# Patient Record
Sex: Female | Born: 1955 | Race: White | Hispanic: No | Marital: Married | Smoking: Former smoker
Health system: Southern US, Community
[De-identification: ages and names within clinical notes are randomized; demographics above are authoritative.]

## PROBLEM LIST (undated history)

## (undated) DIAGNOSIS — F419 Anxiety disorder, unspecified: Secondary | ICD-10-CM

## (undated) DIAGNOSIS — I10 Essential (primary) hypertension: Secondary | ICD-10-CM

## (undated) DIAGNOSIS — E785 Hyperlipidemia, unspecified: Secondary | ICD-10-CM

## (undated) DIAGNOSIS — K219 Gastro-esophageal reflux disease without esophagitis: Secondary | ICD-10-CM

## (undated) DIAGNOSIS — M1990 Unspecified osteoarthritis, unspecified site: Secondary | ICD-10-CM

## (undated) HISTORY — DX: Essential (primary) hypertension: I10

## (undated) HISTORY — DX: Anxiety disorder, unspecified: F41.9

## (undated) HISTORY — PX: GUM SURGERY: SHX658

## (undated) HISTORY — PX: WISDOM TOOTH EXTRACTION: SHX21

## (undated) HISTORY — DX: Unspecified osteoarthritis, unspecified site: M19.90

## (undated) HISTORY — DX: Hyperlipidemia, unspecified: E78.5

---

## 1998-03-15 ENCOUNTER — Other Ambulatory Visit: Admission: RE | Admit: 1998-03-15 | Discharge: 1998-03-15 | Payer: Self-pay | Admitting: Obstetrics and Gynecology

## 1998-11-01 ENCOUNTER — Other Ambulatory Visit: Admission: RE | Admit: 1998-11-01 | Discharge: 1998-11-01 | Payer: Self-pay | Admitting: Obstetrics and Gynecology

## 1999-06-10 ENCOUNTER — Other Ambulatory Visit: Admission: RE | Admit: 1999-06-10 | Discharge: 1999-06-10 | Payer: Self-pay | Admitting: Obstetrics and Gynecology

## 2000-09-12 ENCOUNTER — Other Ambulatory Visit: Admission: RE | Admit: 2000-09-12 | Discharge: 2000-09-12 | Payer: Self-pay | Admitting: Obstetrics and Gynecology

## 2002-08-28 ENCOUNTER — Other Ambulatory Visit: Admission: RE | Admit: 2002-08-28 | Discharge: 2002-08-28 | Payer: Self-pay | Admitting: Obstetrics and Gynecology

## 2003-11-13 ENCOUNTER — Other Ambulatory Visit: Admission: RE | Admit: 2003-11-13 | Discharge: 2003-11-13 | Payer: Self-pay | Admitting: Obstetrics and Gynecology

## 2004-09-28 ENCOUNTER — Other Ambulatory Visit: Admission: RE | Admit: 2004-09-28 | Discharge: 2004-09-28 | Payer: Self-pay | Admitting: Obstetrics and Gynecology

## 2006-03-30 ENCOUNTER — Encounter: Admission: RE | Admit: 2006-03-30 | Discharge: 2006-03-30 | Payer: Self-pay | Admitting: Obstetrics and Gynecology

## 2009-02-11 ENCOUNTER — Encounter: Admission: RE | Admit: 2009-02-11 | Discharge: 2009-03-03 | Payer: Self-pay | Admitting: Family Medicine

## 2009-03-04 ENCOUNTER — Encounter: Admission: RE | Admit: 2009-03-04 | Discharge: 2009-03-09 | Payer: Self-pay | Admitting: Family Medicine

## 2010-03-27 ENCOUNTER — Encounter: Payer: Self-pay | Admitting: Obstetrics and Gynecology

## 2011-02-01 ENCOUNTER — Encounter: Payer: Self-pay | Admitting: Gastroenterology

## 2011-02-23 ENCOUNTER — Encounter: Payer: Self-pay | Admitting: Gastroenterology

## 2011-02-23 ENCOUNTER — Ambulatory Visit (AMBULATORY_SURGERY_CENTER): Payer: BC Managed Care – PPO | Admitting: *Deleted

## 2011-02-23 VITALS — Ht 63.0 in | Wt 135.8 lb

## 2011-02-23 DIAGNOSIS — Z1211 Encounter for screening for malignant neoplasm of colon: Secondary | ICD-10-CM

## 2011-02-23 MED ORDER — PEG-KCL-NACL-NASULF-NA ASC-C 100 G PO SOLR: 1.0000 | Freq: Once | ORAL | Status: DC

## 2011-03-15 ENCOUNTER — Telehealth: Payer: Self-pay | Admitting: Gastroenterology

## 2011-03-15 NOTE — Telephone Encounter (Signed)
Called patient regarding the questions she had about the prep for her colonoscopy tomorrow.  I answered all questions and patient was satisfied.

## 2011-03-16 ENCOUNTER — Ambulatory Visit (AMBULATORY_SURGERY_CENTER): Payer: BC Managed Care – PPO | Admitting: Gastroenterology

## 2011-03-16 ENCOUNTER — Encounter: Payer: Self-pay | Admitting: Gastroenterology

## 2011-03-16 ENCOUNTER — Other Ambulatory Visit: Payer: Self-pay | Admitting: Gastroenterology

## 2011-03-16 DIAGNOSIS — D126 Benign neoplasm of colon, unspecified: Secondary | ICD-10-CM

## 2011-03-16 DIAGNOSIS — Z1211 Encounter for screening for malignant neoplasm of colon: Secondary | ICD-10-CM

## 2011-03-16 MED ORDER — SODIUM CHLORIDE 0.9 % IV SOLN: 500.0000 mL | INTRAVENOUS | Status: DC

## 2011-03-16 NOTE — Op Note (Signed)
[facility] [address] [place], [place]  [address]  COLONOSCOPY PROCEDURE REPORT  PATIENT:  Pamela, Ross  MR#:  [id number] BIRTHDATE:  06-Jan-1956, 55 yrs. old  GENDER:  female ENDOSCOPIST:  Judie Petit T. Russella Dar, MD, [facility] Referred by:  Duane Lope, M.D. PROCEDURE DATE:  03/16/2011 PROCEDURE:  Colonoscopy with snare polypectomy ASA CLASS:  Class II INDICATIONS:  1) Routine Risk Screening MEDICATIONS:   These medications were titrated to patient response per physician's verbal order, Fentanyl 125 mcg IV, Versed 10 mg IV DESCRIPTION OF PROCEDURE:  After the risks benefits and alternatives of the procedure were thoroughly explained, informed consent was obtained.  Digital rectal exam was performed and revealed no abnormalities. The [id number] endoscope was introduced through the anus and advanced to the cecum, which was identified by both the appendix and ileocecal valve, without limitations.  The quality of the prep was good, using MoviPrep. The instrument was then slowly withdrawn as the colon was fully examined.<<PROCEDUREIMAGES>> FINDINGS:  A sessile polyp was found in the descending colon. It was 5 mm in size. Polyp was snared without cautery. Retrieval was unsuccessful.  A sessile polyp was found in the sigmoid colon. It was 5 mm in size. Polyp was snared without cautery. Retrieval was successful. Otherwise normal colonoscopy without other polyps, masses, vascular ectasias, or inflammatory changes. Retroflexed views in the rectum revealed internal hemorrhoids, moderate. The time to cecum =  5  minutes. The scope was then withdrawn (time = 12  min) from the patient and the procedure completed.  COMPLICATIONS:  None  ENDOSCOPIC IMPRESSION: 1) 5 mm sessile polyp in the descending colon 2) 5 mm sessile polyp in the sigmoid colon 3) Internal hemorrhoids  RECOMMENDATIONS: 1) Await pathology results 2) If the polyps are adenomatous (pre-cancerous),  repeat colonoscopy in 5 years. Otherwise to follow colorectal cancer screening guidelines for "routine risk" patients with colonoscopy in 10 years.  Venita Lick. Russella Dar, MD, [facility]  n. eSIGNED:   Venita Lick. Maralyn Witherell at 03/16/2011 10:12 AM  Geralyn Flash, [id number]

## 2011-03-16 NOTE — Progress Notes (Signed)
Patient did not experience any of the following events: a burn prior to discharge; a fall within the facility; wrong site/side/patient/procedure/implant event; or a hospital transfer or hospital admission upon discharge from the facility. (G8907) Patient did not have preoperative order for IV antibiotic SSI prophylaxis. (G8918)  

## 2011-03-17 ENCOUNTER — Telehealth: Payer: Self-pay | Admitting: *Deleted

## 2011-03-17 NOTE — Telephone Encounter (Signed)
Follow up Call- Patient questions:  Do you have a fever, pain , or abdominal swelling? no Pain Score  0 *  Have you tolerated food without any problems? yes  Have you been able to return to your normal activities? yes  Do you have any questions about your discharge instructions: Diet   yes Medications  no Follow up visit  no  Do you have questions or concerns about your Care? yes  Actions: * If pain score is 4 or above: No action needed, pain <4.   

## 2011-03-22 ENCOUNTER — Encounter: Payer: Self-pay | Admitting: Gastroenterology

## 2011-10-27 ENCOUNTER — Ambulatory Visit: Payer: BC Managed Care – PPO | Attending: Obstetrics and Gynecology | Admitting: Physical Therapy

## 2011-10-27 DIAGNOSIS — IMO0001 Reserved for inherently not codable concepts without codable children: Secondary | ICD-10-CM | POA: Insufficient documentation

## 2011-10-27 DIAGNOSIS — M25519 Pain in unspecified shoulder: Secondary | ICD-10-CM | POA: Insufficient documentation

## 2011-11-03 ENCOUNTER — Ambulatory Visit: Payer: BC Managed Care – PPO | Admitting: Physical Therapy

## 2011-11-08 ENCOUNTER — Ambulatory Visit: Payer: BC Managed Care – PPO | Attending: Obstetrics and Gynecology | Admitting: Physical Therapy

## 2011-11-08 DIAGNOSIS — IMO0001 Reserved for inherently not codable concepts without codable children: Secondary | ICD-10-CM | POA: Insufficient documentation

## 2011-11-08 DIAGNOSIS — M25519 Pain in unspecified shoulder: Secondary | ICD-10-CM | POA: Insufficient documentation

## 2011-11-10 ENCOUNTER — Ambulatory Visit: Payer: BC Managed Care – PPO | Admitting: Physical Therapy

## 2011-11-13 ENCOUNTER — Encounter: Payer: BC Managed Care – PPO | Admitting: Physical Therapy

## 2011-11-15 ENCOUNTER — Ambulatory Visit: Payer: BC Managed Care – PPO | Admitting: Physical Therapy

## 2011-11-22 ENCOUNTER — Ambulatory Visit: Payer: BC Managed Care – PPO | Admitting: Physical Therapy

## 2011-11-24 ENCOUNTER — Ambulatory Visit: Payer: BC Managed Care – PPO | Admitting: Physical Therapy

## 2011-11-29 ENCOUNTER — Ambulatory Visit: Payer: BC Managed Care – PPO | Admitting: Physical Therapy

## 2011-12-06 ENCOUNTER — Ambulatory Visit: Payer: BC Managed Care – PPO | Attending: Obstetrics and Gynecology | Admitting: Physical Therapy

## 2011-12-06 DIAGNOSIS — IMO0001 Reserved for inherently not codable concepts without codable children: Secondary | ICD-10-CM | POA: Insufficient documentation

## 2011-12-06 DIAGNOSIS — M25519 Pain in unspecified shoulder: Secondary | ICD-10-CM | POA: Insufficient documentation

## 2011-12-11 ENCOUNTER — Ambulatory Visit: Payer: BC Managed Care – PPO | Admitting: Physical Therapy

## 2011-12-14 ENCOUNTER — Encounter (INDEPENDENT_AMBULATORY_CARE_PROVIDER_SITE_OTHER): Payer: BC Managed Care – PPO | Admitting: Ophthalmology

## 2011-12-14 DIAGNOSIS — H35039 Hypertensive retinopathy, unspecified eye: Secondary | ICD-10-CM

## 2011-12-14 DIAGNOSIS — H2510 Age-related nuclear cataract, unspecified eye: Secondary | ICD-10-CM

## 2011-12-14 DIAGNOSIS — H35349 Macular cyst, hole, or pseudohole, unspecified eye: Secondary | ICD-10-CM

## 2011-12-14 DIAGNOSIS — H43819 Vitreous degeneration, unspecified eye: Secondary | ICD-10-CM

## 2011-12-14 DIAGNOSIS — I10 Essential (primary) hypertension: Secondary | ICD-10-CM

## 2011-12-15 DIAGNOSIS — H35349 Macular cyst, hole, or pseudohole, unspecified eye: Secondary | ICD-10-CM

## 2011-12-15 NOTE — H&P (Signed)
Pamela Ross is an 56 y.o. female.   Chief Complaint:loss of vision right eye HPI: macular hole right eye  Past Medical History  Diagnosis Date  . Anxiety   . Arthritis   . Hyperlipidemia   . Hypertension     Past Surgical History  Procedure Date  . Gum surgery     Family History  Problem Relation Age of Onset  . Colon cancer Neg Hx   . Esophageal cancer Neg Hx   . Stomach cancer Neg Hx   . Rectal cancer Neg Hx    Social History:  reports that she has been smoking.  She does not have any smokeless tobacco history on file. She reports that she drinks about 4.2 ounces of alcohol per week. She reports that she does not use illicit drugs.  Allergies:  Allergies  Allergen Reactions  . Codeine Nausea Only  . Erythromycin Diarrhea    No prescriptions prior to admission    Review of systems otherwise negative  There were no vitals taken for this visit.  Physical exam: Mental status: oriented x3. Eyes: See eye exam associated with this date of surgery in media tab.  Scanned in by scanning center Ears, Nose, Throat: within normal limits Neck: Within Normal limits General: within normal limits Chest: Within normal limits Breast: deferred Heart: Within normal limits Abdomen: Within normal limits GU: deferred Extremities: within normal limits Skin: within normal limits  Assessment/Plan Macular hole right eye Plan: To [facility] for Pars plana vitrectomy, serum patch, laser, gas injection right eye  Sherrie George 12/15/2011, 10:35 AM

## 2011-12-19 ENCOUNTER — Ambulatory Visit: Payer: BC Managed Care – PPO | Admitting: Physical Therapy

## 2011-12-22 ENCOUNTER — Encounter (HOSPITAL_COMMUNITY): Payer: Self-pay | Admitting: *Deleted

## 2011-12-22 NOTE — Progress Notes (Signed)
Pt would like to take medicines from home.

## 2011-12-25 ENCOUNTER — Encounter (HOSPITAL_COMMUNITY): Payer: Self-pay | Admitting: Pharmacy Technician

## 2011-12-25 MED ORDER — CEFAZOLIN SODIUM-DEXTROSE 2-3 GM-% IV SOLR
2.0000 g | INTRAVENOUS | Status: DC
  Filled 2011-12-25: qty 50

## 2011-12-26 ENCOUNTER — Encounter (HOSPITAL_COMMUNITY)
Admission: RE | Disposition: A | Discharge status: Home / Self Care | Payer: Self-pay | Source: Ambulatory Visit | Attending: Ophthalmology

## 2011-12-26 ENCOUNTER — Ambulatory Visit (HOSPITAL_COMMUNITY): Payer: BC Managed Care – PPO

## 2011-12-26 ENCOUNTER — Ambulatory Visit (HOSPITAL_COMMUNITY)
Admission: RE | Admit: 2011-12-26 | Discharge: 2011-12-27 | Disposition: A | Discharge status: Home / Self Care | Payer: BC Managed Care – PPO | Source: Ambulatory Visit | Attending: Ophthalmology | Admitting: Ophthalmology

## 2011-12-26 ENCOUNTER — Encounter (HOSPITAL_COMMUNITY): Payer: Self-pay | Admitting: *Deleted

## 2011-12-26 ENCOUNTER — Encounter (HOSPITAL_COMMUNITY): Payer: Self-pay

## 2011-12-26 DIAGNOSIS — H35349 Macular cyst, hole, or pseudohole, unspecified eye: Secondary | ICD-10-CM

## 2011-12-26 DIAGNOSIS — I10 Essential (primary) hypertension: Secondary | ICD-10-CM | POA: Insufficient documentation

## 2011-12-26 HISTORY — PX: GAS INSERTION: SHX5336

## 2011-12-26 LAB — CBC
HCT: 38.6 % (ref 36.0–46.0)
MCV: 92.1 fL (ref 78.0–100.0)
RBC: 4.19 MIL/uL (ref 3.87–5.11)
RDW: 12.5 % (ref 11.5–15.5)
WBC: 9.9 10*3/uL (ref 4.0–10.5)

## 2011-12-26 LAB — BASIC METABOLIC PANEL
CO2: 25 mEq/L (ref 19–32)
Chloride: 90 mEq/L — ABNORMAL LOW (ref 96–112)
Creatinine, Ser: 0.65 mg/dL (ref 0.50–1.10)

## 2011-12-26 LAB — SURGICAL PCR SCREEN: Staphylococcus aureus: NEGATIVE

## 2011-12-26 SURGERY — 25 GAUGE PARS PLANA VITRECTOMY WITH 20 GAUGE MVR PORT FOR MACULAR HOLE
Anesthesia: General | Site: Eye | Laterality: Right | Wound class: Clean

## 2011-12-26 MED ORDER — ONDANSETRON HCL 4 MG/2ML IJ SOLN: 4.0000 mg | Freq: Four times a day (QID) | INTRAMUSCULAR | Status: DC | PRN

## 2011-12-26 MED ORDER — ATROPINE SULFATE 1 % OP SOLN
OPHTHALMIC | Status: DC | PRN
  Administered 2011-12-26: 2 [drp] via OPHTHALMIC

## 2011-12-26 MED ORDER — SIMETHICONE 80 MG PO CHEW
160.0000 mg | CHEWABLE_TABLET | Freq: Four times a day (QID) | ORAL | Status: DC | PRN
  Administered 2011-12-26 – 2011-12-27 (×2): 160 mg via ORAL
  Filled 2011-12-26 (×2): qty 2

## 2011-12-26 MED ORDER — EPINEPHRINE HCL 1 MG/ML IJ SOLN
INTRAMUSCULAR | Status: AC
  Filled 2011-12-26: qty 1

## 2011-12-26 MED ORDER — BSS IO SOLN
INTRAOCULAR | Status: AC
  Filled 2011-12-26: qty 15

## 2011-12-26 MED ORDER — CALCIUM CARBONATE 600 MG PO TABS
600.0000 mg | ORAL_TABLET | Freq: Two times a day (BID) | ORAL | Status: DC
  Filled 2011-12-26 (×2): qty 1

## 2011-12-26 MED ORDER — PROVISC 10 MG/ML IO SOLN
INTRAOCULAR | Status: DC | PRN
  Administered 2011-12-26: .85 mL via INTRAOCULAR

## 2011-12-26 MED ORDER — BACITRACIN-POLYMYXIN B 500-10000 UNIT/GM OP OINT: 1.0000 "application " | TOPICAL_OINTMENT | Freq: Four times a day (QID) | OPHTHALMIC | Status: DC

## 2011-12-26 MED ORDER — GATIFLOXACIN 0.5 % OP SOLN
OPHTHALMIC | Status: AC
  Administered 2011-12-26: 1 [drp] via OPHTHALMIC
  Filled 2011-12-26: qty 2.5

## 2011-12-26 MED ORDER — CALCIUM CARBONATE 1250 (500 CA) MG PO TABS
1.0000 | ORAL_TABLET | Freq: Two times a day (BID) | ORAL | Status: DC
  Filled 2011-12-26 (×4): qty 1

## 2011-12-26 MED ORDER — NEOSTIGMINE METHYLSULFATE 1 MG/ML IJ SOLN
INTRAMUSCULAR | Status: DC | PRN
  Administered 2011-12-26: 3 mg via INTRAVENOUS

## 2011-12-26 MED ORDER — PHENYLEPHRINE HCL 2.5 % OP SOLN
1.0000 [drp] | OPHTHALMIC | Status: AC | PRN
  Administered 2011-12-26 (×3): 1 [drp] via OPHTHALMIC

## 2011-12-26 MED ORDER — DEXAMETHASONE SODIUM PHOSPHATE 10 MG/ML IJ SOLN
INTRAMUSCULAR | Status: DC | PRN
  Administered 2011-12-26: 10 mg

## 2011-12-26 MED ORDER — ATROPINE SULFATE 1 % OP SOLN
OPHTHALMIC | Status: AC
  Filled 2011-12-26: qty 2

## 2011-12-26 MED ORDER — LOSARTAN POTASSIUM 50 MG PO TABS
100.0000 mg | ORAL_TABLET | Freq: Every day | ORAL | Status: DC
  Filled 2011-12-26 (×2): qty 2

## 2011-12-26 MED ORDER — ACETAZOLAMIDE SODIUM 500 MG IJ SOLR
INTRAMUSCULAR | Status: AC
  Filled 2011-12-26: qty 500

## 2011-12-26 MED ORDER — MAGNESIUM GLUCONATE 500 MG PO TABS
500.0000 mg | ORAL_TABLET | Freq: Every day | ORAL | Status: DC
  Filled 2011-12-26 (×2): qty 1

## 2011-12-26 MED ORDER — DOCUSATE SODIUM 100 MG PO CAPS
100.0000 mg | ORAL_CAPSULE | Freq: Two times a day (BID) | ORAL | Status: DC
  Administered 2011-12-26: 100 mg via ORAL
  Filled 2011-12-26: qty 1

## 2011-12-26 MED ORDER — HYPROMELLOSE (GONIOSCOPIC) 2.5 % OP SOLN
OPHTHALMIC | Status: AC
  Filled 2011-12-26: qty 15

## 2011-12-26 MED ORDER — PHENYLEPHRINE HCL 2.5 % OP SOLN
OPHTHALMIC | Status: AC
  Administered 2011-12-26: 1 [drp] via OPHTHALMIC
  Filled 2011-12-26: qty 3

## 2011-12-26 MED ORDER — BUPIVACAINE HCL (PF) 0.75 % IJ SOLN
INTRAMUSCULAR | Status: DC | PRN
  Administered 2011-12-26: 10 mL

## 2011-12-26 MED ORDER — FENTANYL CITRATE 0.05 MG/ML IJ SOLN
INTRAMUSCULAR | Status: DC | PRN
  Administered 2011-12-26: 100 ug via INTRAVENOUS

## 2011-12-26 MED ORDER — GATIFLOXACIN 0.5 % OP SOLN
1.0000 [drp] | Freq: Four times a day (QID) | OPHTHALMIC | Status: DC
  Filled 2011-12-26: qty 2.5

## 2011-12-26 MED ORDER — MINERAL OIL LIGHT 100 % EX OIL
TOPICAL_OIL | CUTANEOUS | Status: AC
  Filled 2011-12-26: qty 25

## 2011-12-26 MED ORDER — LIDOCAINE HCL 2 % IJ SOLN
INTRAMUSCULAR | Status: AC
  Filled 2011-12-26: qty 20

## 2011-12-26 MED ORDER — ROCURONIUM BROMIDE 100 MG/10ML IV SOLN
INTRAVENOUS | Status: DC | PRN
  Administered 2011-12-26: 40 mg via INTRAVENOUS

## 2011-12-26 MED ORDER — HYDROCODONE-ACETAMINOPHEN 5-325 MG PO TABS
1.0000 | ORAL_TABLET | ORAL | Status: DC | PRN
  Administered 2011-12-26 (×2): 2 via ORAL
  Filled 2011-12-26 (×2): qty 2

## 2011-12-26 MED ORDER — DEXAMETHASONE SODIUM PHOSPHATE 10 MG/ML IJ SOLN
INTRAMUSCULAR | Status: AC
  Filled 2011-12-26: qty 1

## 2011-12-26 MED ORDER — PREDNISOLONE ACETATE 1 % OP SUSP
1.0000 [drp] | Freq: Four times a day (QID) | OPHTHALMIC | Status: DC
Start: 2011-12-27 — End: 2011-12-27
  Filled 2011-12-26: qty 5
  Filled 2011-12-26: qty 1

## 2011-12-26 MED ORDER — ONDANSETRON HCL 4 MG/2ML IJ SOLN
INTRAMUSCULAR | Status: DC | PRN
  Administered 2011-12-26: 4 mg via INTRAVENOUS

## 2011-12-26 MED ORDER — BACITRACIN-POLYMYXIN B 500-10000 UNIT/GM OP OINT
TOPICAL_OINTMENT | OPHTHALMIC | Status: DC | PRN
  Administered 2011-12-26: 1 via OPHTHALMIC

## 2011-12-26 MED ORDER — GLYCOPYRROLATE 0.2 MG/ML IJ SOLN
INTRAMUSCULAR | Status: DC | PRN
  Administered 2011-12-26: 0.6 mg via INTRAVENOUS

## 2011-12-26 MED ORDER — TEMAZEPAM 15 MG PO CAPS: 15.0000 mg | ORAL_CAPSULE | Freq: Every evening | ORAL | Status: DC | PRN

## 2011-12-26 MED ORDER — CITALOPRAM HYDROBROMIDE 20 MG PO TABS
20.0000 mg | ORAL_TABLET | Freq: Once | ORAL | Status: DC
  Filled 2011-12-26: qty 1

## 2011-12-26 MED ORDER — BRIMONIDINE TARTRATE 0.2 % OP SOLN
1.0000 [drp] | Freq: Two times a day (BID) | OPHTHALMIC | Status: DC
  Filled 2011-12-26 (×2): qty 5

## 2011-12-26 MED ORDER — NAPROXEN 250 MG PO TABS
250.0000 mg | ORAL_TABLET | Freq: Two times a day (BID) | ORAL | Status: DC | PRN
  Filled 2011-12-26: qty 1

## 2011-12-26 MED ORDER — LACTATED RINGERS IV SOLN: INTRAVENOUS | Status: DC | PRN

## 2011-12-26 MED ORDER — CLONAZEPAM 0.5 MG PO TABS: 0.5000 mg | ORAL_TABLET | Freq: Every day | ORAL | Status: DC

## 2011-12-26 MED ORDER — EPINEPHRINE HCL 1 MG/ML IJ SOLN
INTRAOCULAR | Status: DC | PRN
  Administered 2011-12-26: 12:00:00

## 2011-12-26 MED ORDER — SODIUM CHLORIDE 0.9 % IJ SOLN
INTRAMUSCULAR | Status: DC | PRN
  Administered 2011-12-26: 13:00:00

## 2011-12-26 MED ORDER — CARVEDILOL 6.25 MG PO TABS
6.2500 mg | ORAL_TABLET | Freq: Two times a day (BID) | ORAL | Status: DC
  Filled 2011-12-26 (×4): qty 1

## 2011-12-26 MED ORDER — SODIUM CHLORIDE 0.9 % IV SOLN
INTRAVENOUS | Status: DC
  Administered 2011-12-26 (×2): via INTRAVENOUS

## 2011-12-26 MED ORDER — SODIUM CHLORIDE 0.45 % IV SOLN
INTRAVENOUS | Status: DC
  Administered 2011-12-26: 1000 mL via INTRAVENOUS

## 2011-12-26 MED ORDER — TETRACAINE HCL 0.5 % OP SOLN
2.0000 [drp] | Freq: Once | OPHTHALMIC | Status: DC
  Filled 2011-12-26: qty 2

## 2011-12-26 MED ORDER — MORPHINE SULFATE 2 MG/ML IJ SOLN: 1.0000 mg | INTRAMUSCULAR | Status: DC | PRN

## 2011-12-26 MED ORDER — PHENYLEPHRINE HCL 10 MG/ML IJ SOLN
INTRAMUSCULAR | Status: DC | PRN
  Administered 2011-12-26: 40 ug via INTRAVENOUS
  Administered 2011-12-26 (×2): 80 ug via INTRAVENOUS

## 2011-12-26 MED ORDER — MIDAZOLAM HCL 5 MG/5ML IJ SOLN
INTRAMUSCULAR | Status: DC | PRN
Start: 2011-12-26 — End: 2011-12-26
  Administered 2011-12-26: 2 mg via INTRAVENOUS

## 2011-12-26 MED ORDER — HYALURONIDASE HUMAN 150 UNIT/ML IJ SOLN
INTRAMUSCULAR | Status: AC
  Filled 2011-12-26: qty 1

## 2011-12-26 MED ORDER — GATIFLOXACIN 0.5 % OP SOLN
1.0000 [drp] | OPHTHALMIC | Status: AC | PRN
  Administered 2011-12-26 (×3): 1 [drp] via OPHTHALMIC

## 2011-12-26 MED ORDER — BSS PLUS IO SOLN
INTRAOCULAR | Status: AC
  Filled 2011-12-26: qty 500

## 2011-12-26 MED ORDER — POLYMYXIN B SULFATE 500000 UNITS IJ SOLR
INTRAMUSCULAR | Status: AC
  Filled 2011-12-26: qty 1

## 2011-12-26 MED ORDER — LATANOPROST 0.005 % OP SOLN
1.0000 [drp] | Freq: Every day | OPHTHALMIC | Status: DC
  Filled 2011-12-26 (×2): qty 2.5

## 2011-12-26 MED ORDER — BSS IO SOLN
INTRAOCULAR | Status: DC | PRN
  Administered 2011-12-26: 15 mL via INTRAOCULAR

## 2011-12-26 MED ORDER — TROPICAMIDE 1 % OP SOLN
OPHTHALMIC | Status: AC
  Administered 2011-12-26: 1 [drp] via OPHTHALMIC
  Filled 2011-12-26: qty 3

## 2011-12-26 MED ORDER — MAGNESIUM HYDROXIDE 400 MG/5ML PO SUSP: 15.0000 mL | Freq: Four times a day (QID) | ORAL | Status: DC | PRN

## 2011-12-26 MED ORDER — ACETAZOLAMIDE SODIUM 500 MG IJ SOLR
500.0000 mg | Freq: Once | INTRAMUSCULAR | Status: AC
  Administered 2011-12-27: 500 mg via INTRAVENOUS
  Filled 2011-12-26: qty 500

## 2011-12-26 MED ORDER — CYCLOPENTOLATE HCL 1 % OP SOLN
OPHTHALMIC | Status: AC
  Administered 2011-12-26: 1 [drp] via OPHTHALMIC
  Filled 2011-12-26: qty 2

## 2011-12-26 MED ORDER — SODIUM HYALURONATE 10 MG/ML IO SOLN
INTRAOCULAR | Status: AC
  Filled 2011-12-26: qty 0.85

## 2011-12-26 MED ORDER — TROPICAMIDE 1 % OP SOLN
1.0000 [drp] | OPHTHALMIC | Status: AC | PRN
  Administered 2011-12-26 (×3): 1 [drp] via OPHTHALMIC

## 2011-12-26 MED ORDER — NAPROXEN SODIUM 220 MG PO TABS: 220.0000 mg | ORAL_TABLET | Freq: Two times a day (BID) | ORAL | Status: DC | PRN

## 2011-12-26 MED ORDER — ACETAMINOPHEN 325 MG PO TABS: 325.0000 mg | ORAL_TABLET | ORAL | Status: DC | PRN

## 2011-12-26 MED ORDER — CYCLOPENTOLATE HCL 1 % OP SOLN
1.0000 [drp] | OPHTHALMIC | Status: AC | PRN
  Administered 2011-12-26 (×3): 1 [drp] via OPHTHALMIC

## 2011-12-26 MED ORDER — GENTAMICIN SULFATE 40 MG/ML IJ SOLN
INTRAMUSCULAR | Status: AC
  Filled 2011-12-26: qty 2

## 2011-12-26 MED ORDER — MORPHINE SULFATE 4 MG/ML IJ SOLN
INTRAMUSCULAR | Status: AC
  Administered 2011-12-26: 4 mg
  Filled 2011-12-26: qty 1

## 2011-12-26 MED ORDER — BACITRACIN-POLYMYXIN B 500-10000 UNIT/GM OP OINT
TOPICAL_OINTMENT | OPHTHALMIC | Status: AC
  Filled 2011-12-26: qty 3.5

## 2011-12-26 MED ORDER — LIDOCAINE HCL (CARDIAC) 20 MG/ML IV SOLN
INTRAVENOUS | Status: DC | PRN
  Administered 2011-12-26: 80 mg via INTRAVENOUS

## 2011-12-26 MED ORDER — HEMOSTATIC AGENTS (NO CHARGE) OPTIME
TOPICAL | Status: DC | PRN
  Administered 2011-12-26: 1 via TOPICAL

## 2011-12-26 MED ORDER — MUPIROCIN 2 % EX OINT
TOPICAL_OINTMENT | CUTANEOUS | Status: AC
  Administered 2011-12-26: 1 via NASAL
  Filled 2011-12-26: qty 22

## 2011-12-26 MED ORDER — PROPOFOL 10 MG/ML IV BOLUS
INTRAVENOUS | Status: DC | PRN
  Administered 2011-12-26: 150 mg via INTRAVENOUS

## 2011-12-26 SURGICAL SUPPLY — 66 items
ACCESSORY FRAGMATOME (MISCELLANEOUS) IMPLANT
APL SRG 3 HI ABS STRL LF PLS (MISCELLANEOUS)
APPLICATOR DR MATTHEWS STRL (MISCELLANEOUS) IMPLANT
BALL CTTN LRG ABS STRL LF (GAUZE/BANDAGES/DRESSINGS) ×3
BLADE EYE CATARACT 19 1.4 BEAV (BLADE) IMPLANT
BLADE MVR KNIFE 19G (BLADE) IMPLANT
BLADE MVR KNIFE 20G (BLADE) ×2 IMPLANT
CANNULA ANT CHAM MAIN (OPHTHALMIC RELATED) IMPLANT
CANNULA FLEX TIP 25G (CANNULA) ×2 IMPLANT
CANNULA SUBRETINAL FLUID 20G (BLADE) ×2 IMPLANT
CLOTH BEACON ORANGE TIMEOUT ST (SAFETY) ×2 IMPLANT
CORDS BIPOLAR (ELECTRODE) ×1 IMPLANT
COTTONBALL LRG STERILE PKG (GAUZE/BANDAGES/DRESSINGS) ×6 IMPLANT
COVER MAYO STAND STRL (DRAPES) IMPLANT
DRAPE OPHTHALMIC 77X100 STRL (CUSTOM PROCEDURE TRAY) ×2 IMPLANT
EAGLE VIT/RET MICRO PIC 168 25 (MISCELLANEOUS) IMPLANT
ERASER HMR WETFIELD 23G BP (MISCELLANEOUS) IMPLANT
FILTER BLUE MILLIPORE (MISCELLANEOUS) ×4 IMPLANT
FILTER STRAW FLUID ASPIR (MISCELLANEOUS) ×2 IMPLANT
GAS OPHTHALMIC (MISCELLANEOUS) ×1 IMPLANT
GLOVE SS BIOGEL STRL SZ 6.5 (GLOVE) ×1 IMPLANT
GLOVE SS BIOGEL STRL SZ 7 (GLOVE) ×1 IMPLANT
GLOVE SUPERSENSE BIOGEL SZ 6.5 (GLOVE) ×1
GLOVE SUPERSENSE BIOGEL SZ 7 (GLOVE) ×1
GLOVE SURG 8.5 LATEX PF (GLOVE) ×2 IMPLANT
GOWN STRL NON-REIN LRG LVL3 (GOWN DISPOSABLE) ×6 IMPLANT
ILLUMINATOR CHOW PICK 25GA (MISCELLANEOUS) ×2 IMPLANT
KIT BASIN OR (CUSTOM PROCEDURE TRAY) ×2 IMPLANT
KIT ROOM TURNOVER OR (KITS) ×2 IMPLANT
KNIFE CRESCENT 1.75 EDGEAHEAD (BLADE) ×2 IMPLANT
KNIFE GRIESHABER SHARP 2.5MM (MISCELLANEOUS) IMPLANT
MASK EYE SHIELD (GAUZE/BANDAGES/DRESSINGS) ×1 IMPLANT
NDL 18GX1X1/2 (RX/OR ONLY) (NEEDLE) ×1 IMPLANT
NDL 25GX 5/8IN NON SAFETY (NEEDLE) ×1 IMPLANT
NDL HYPO 30X.5 LL (NEEDLE) ×2 IMPLANT
NEEDLE 18GX1X1/2 (RX/OR ONLY) (NEEDLE) ×2 IMPLANT
NEEDLE 25GX 5/8IN NON SAFETY (NEEDLE) ×2 IMPLANT
NEEDLE 27GAX1X1/2 (NEEDLE) IMPLANT
NEEDLE HYPO 30X.5 LL (NEEDLE) ×4 IMPLANT
NS IRRIG 1000ML POUR BTL (IV SOLUTION) ×2 IMPLANT
PACK VITRECTOMY CUSTOM (CUSTOM PROCEDURE TRAY) ×2 IMPLANT
PAD ARMBOARD 7.5X6 YLW CONV (MISCELLANEOUS) ×4 IMPLANT
PAD EYE OVAL STERILE LF (GAUZE/BANDAGES/DRESSINGS) ×1 IMPLANT
PAK VITRECTOMY PIK 25 GA (OPHTHALMIC RELATED) IMPLANT
PROBE DIRECTIONAL LASER (MISCELLANEOUS) IMPLANT
REPL STRA BRUSH NDL (NEEDLE) ×1 IMPLANT
REPL STRA BRUSH NEEDLE (NEEDLE) ×2 IMPLANT
RESERVOIR BACK FLUSH (MISCELLANEOUS) ×2 IMPLANT
ROLLS DENTAL (MISCELLANEOUS) ×4 IMPLANT
SCRAPER DIAMOND 25GA (OPHTHALMIC RELATED) IMPLANT
SCRAPER DIAMOND DUST MEMBRANE (MISCELLANEOUS) ×2 IMPLANT
SPONGE SURGIFOAM ABS GEL 12-7 (HEMOSTASIS) ×2 IMPLANT
STOPCOCK 4 WAY LG BORE MALE ST (IV SETS) ×2 IMPLANT
SUT CHROMIC 7 0 TG140 8 (SUTURE) IMPLANT
SUT ETHILON 9 0 TG140 8 (SUTURE) ×2 IMPLANT
SUT POLY NON ABSORB 10-0 8 STR (SUTURE) IMPLANT
SUT SILK 4 0 RB 1 (SUTURE) IMPLANT
SYR 20CC LL (SYRINGE) ×2 IMPLANT
SYR 50ML LL SCALE MARK (SYRINGE) ×2 IMPLANT
SYR 5ML LL (SYRINGE) IMPLANT
SYR BULB 3OZ (MISCELLANEOUS) ×2 IMPLANT
SYR TB 1ML LUER SLIP (SYRINGE) ×2 IMPLANT
SYRINGE 10CC LL (SYRINGE) ×2 IMPLANT
TOWEL OR 17X24 6PK STRL BLUE (TOWEL DISPOSABLE) ×6 IMPLANT
WATER STERILE IRR 1000ML POUR (IV SOLUTION) ×2 IMPLANT
WIPE INSTRUMENT VISIWIPE 73X73 (MISCELLANEOUS) ×2 IMPLANT

## 2011-12-26 NOTE — Anesthesia Preprocedure Evaluation (Addendum)
Anesthesia Evaluation  Patient identified by MRN, date of birth, ID band Patient awake    Reviewed: Allergy & Precautions, H&P , NPO status   Airway Mallampati: II      Dental  (+) Teeth Intact   Pulmonary neg pulmonary ROS,  breath sounds clear to auscultation        Cardiovascular hypertension, Pt. on medications Rhythm:Regular Rate:Normal     Neuro/Psych negative neurological ROS     GI/Hepatic negative GI ROS, Neg liver ROS,   Endo/Other  negative endocrine ROS  Renal/GU negative Renal ROS     Musculoskeletal   Abdominal   Peds  Hematology   Anesthesia Other Findings   Reproductive/Obstetrics                          Anesthesia Physical Anesthesia Plan  ASA: III  Anesthesia Plan: General   Post-op Pain Management:    Induction: Intravenous  Airway Management Planned: Oral ETT  Additional Equipment:   Intra-op Plan:   Post-operative Plan: Extubation in OR  Informed Consent:   Plan Discussed with: CRNA and Anesthesiologist  Anesthesia Plan Comments:         Anesthesia Quick Evaluation

## 2011-12-26 NOTE — H&P (Signed)
I examined the patient today and there is no change in the medical status 

## 2011-12-26 NOTE — Preoperative (Signed)
Beta Blockers   Reason not to administer Beta Blockers:Not Applicable 

## 2011-12-26 NOTE — Brief Op Note (Signed)
12/26/2011  1:13 PM  PATIENT:  Pamela Ross  56 y.o. 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  Assistant:  Rosalie Doctor SA    Anesthesia: General  Specimen: none  Estimated blood loss:  1cc  Complications: none  Patient sent to PACU in good condition  Composed by Sherrie George MD  Dictation number: [phone]

## 2011-12-26 NOTE — Anesthesia Postprocedure Evaluation (Signed)
  Anesthesia Post-op Note  Patient: Pamela Ross  Procedure(s) Performed: Procedure(s) (LRB) with comments: 25 GAUGE PARS PLANA VITRECTOMY WITH 20 GAUGE MVR PORT FOR MACULAR HOLE (Right) INSERTION OF GAS (Right) SERUM PATCH (Right)  Patient Location: PACU  Anesthesia Type: General  Level of Consciousness: awake, alert  and oriented  Airway and Oxygen Therapy: Patient Spontanous Breathing  Post-op Pain: none  Post-op Assessment: Post-op Vital signs reviewed and Patient's Cardiovascular Status Stable  Post-op Vital Signs: stable  Complications: No apparent anesthesia complications

## 2011-12-26 NOTE — Transfer of Care (Signed)
Immediate Anesthesia Transfer of Care Note  Patient: Pamela Ross  Procedure(s) Performed: Procedure(s) (LRB) with comments: 25 GAUGE PARS PLANA VITRECTOMY WITH 20 GAUGE MVR PORT FOR MACULAR HOLE (Right) INSERTION OF GAS (Right) SERUM PATCH (Right)  Patient Location: PACU  Anesthesia Type: General  Level of Consciousness: awake, alert  and oriented  Airway & Oxygen Therapy: Patient Spontanous Breathing  Post-op Assessment: Report given to PACU RN and Post -op Vital signs reviewed and stable  Post vital signs: Reviewed and stable  Complications: No apparent anesthesia complications

## 2011-12-26 NOTE — Anesthesia Procedure Notes (Signed)
Procedure Name: Intubation Date/Time: 12/26/2011 12:01 PM Performed by: Arlice Colt B Pre-anesthesia Checklist: Patient identified, Emergency Drugs available, Suction available, Patient being monitored and Timeout performed Patient Re-evaluated:Patient Re-evaluated prior to inductionOxygen Delivery Method: Circle system utilized Preoxygenation: Pre-oxygenation with 100% oxygen Intubation Type: IV induction Ventilation: Mask ventilation without difficulty Laryngoscope Size: Mac and 3 Grade View: Grade I Tube type: Oral Tube size: 7.5 mm Number of attempts: 1 Airway Equipment and Method: Stylet Placement Confirmation: ETT inserted through vocal cords under direct vision,  positive ETCO2 and breath sounds checked- equal and bilateral Secured at: 21 cm Tube secured with: Tape Dental Injury: Teeth and Oropharynx as per pre-operative assessment

## 2011-12-27 ENCOUNTER — Encounter (HOSPITAL_COMMUNITY): Payer: Self-pay | Admitting: Ophthalmology

## 2011-12-27 MED ORDER — BACITRACIN-POLYMYXIN B 500-10000 UNIT/GM OP OINT: 1.0000 "application " | TOPICAL_OINTMENT | Freq: Four times a day (QID) | OPHTHALMIC | Status: DC

## 2011-12-27 MED ORDER — PREDNISOLONE ACETATE 1 % OP SUSP: 1.0000 [drp] | Freq: Four times a day (QID) | OPHTHALMIC | Status: DC

## 2011-12-27 MED ORDER — GATIFLOXACIN 0.5 % OP SOLN: 1.0000 [drp] | Freq: Four times a day (QID) | OPHTHALMIC | Status: DC

## 2011-12-27 NOTE — Op Note (Signed)
NAMERONDELL, FRICK NO.:  [id number]  MEDICAL RECORD NO.:  [id number]  LOCATION:  6N11C                        FACILITY:  [facility]  PHYSICIAN:  Beulah Gandy. Ashley Royalty, M.D. DATE OF BIRTH:  02/10/1956  DATE OF PROCEDURE:  12/26/2011 DATE OF DISCHARGE:                              OPERATIVE REPORT   ADMISSION DIAGNOSIS:  Macular hole, right eye.  PROCEDURES:  Pars plana vitrectomy, retinal photocoagulation, gas fluid exchange, membrane peel, serum patch, perfluoropropane injection; all in the right eye.  SURGEON:  Beulah Gandy. Ashley Royalty, M.D.  ASSISTANT:  Rosalie Doctor, SA.  ANESTHESIA:  General.  DETAILS:  Usual prep and drape, the retinal periphery was inspected with the indirect ophthalmoscope laser, 868 burns placed around weak areas in the retina for 360 degrees.  The power was 400 mW 1000 microns each and 0.07 seconds each.  Attention was then carried to the pars plana area where 25-gauge trocars were placed at 8 and 10 o'clock.  MVR incision for 20-gauge instruments at 2 o'clock.  The contact lens ring was anchored into place at 12 o'clock.  Provisc was placed on the corneal surface and the flat contact lens was placed.  Pars plana vitrectomy was begun in a core fashion.  Central vitreous was filled with debris.  The vitrectomy was carried down to the macular surface where the macular hole was apparent.  Silicone tip suction line was drawn down to the macular region and the fish strike sign occurred on each side of the macular hole.  The posterior hyaloid was peeled from its attachments to the edge of the macular hole.  The vitrectomy was carried to the mid periphery where additional vitreous debris was removed.  The vitrectomy was carried into the far periphery with a 30-degree prismatic lens.  All vitreous was removed from the mid and far periphery.  The magnifying contact lens was then placed.  Attention was carried to the macular hole.  The internal limiting  membrane was then peeled from around the macular hole for 1 disk diameter in radius.  All membranes were removed. 100% gas fluid exchange was then carried out.  Serum patch and perfluoropropane mixture were then prepared.  The perfluoropropane was prepared to a 15% concentration.  Additional fluid was removed from the posterior segment with a [place] ophthalmics brush.  Serum patch was then delivered.  Additional fluid and serum patch was removed with a [place] ophthalmics brush.  C3F8 was exchanged for intravitreal gas.  The instruments were removed from the eye and 9-0 nylon was used to close the sclerotomy sites.  The conjunctiva was closed with wet-field cautery.  Polymyxin and gentamicin were irrigated into tenon space.  Atropine solution was applied.  Marcaine was injected around the globe for postop pain.  Decadron 10 mg was injected into the lower subconjunctival space.  Polysporin ophthalmic ointment, a patch and shield were placed.  The patient was awakened, taken to recovery in satisfactory condition.     Beulah Gandy. Ashley Royalty, M.D.     JDM/MEDQ  D:  12/26/2011  T:  12/27/2011  Job:  [id number]

## 2011-12-27 NOTE — Progress Notes (Signed)
12/27/2011, 6:55 AM  Mental Status:  Awake, Alert, Oriented  Anterior segment: Cornea  Clear    Anterior Chamber Clear    Lens:   Cataract  Intra Ocular Pressure 19 mmHg with Tonopen  Vitreous: Clear 90%gas bubble l  Retina:  Attached Good laser reaction   Impression: Excellent result Retina attached   Final Diagnosis: Principal Problem:  *Macular hole   Plan: start post operative eye drops.  Discharge to home.  Give post operative instructions  Sherrie George 12/27/2011, 6:55 AM

## 2011-12-28 NOTE — Discharge Summary (Signed)
Discharge summary not needed on [facility] patients per medical records. 

## 2012-01-02 ENCOUNTER — Ambulatory Visit (INDEPENDENT_AMBULATORY_CARE_PROVIDER_SITE_OTHER): Payer: BC Managed Care – PPO | Admitting: Ophthalmology

## 2012-01-02 DIAGNOSIS — H35349 Macular cyst, hole, or pseudohole, unspecified eye: Secondary | ICD-10-CM

## 2012-01-22 ENCOUNTER — Encounter (INDEPENDENT_AMBULATORY_CARE_PROVIDER_SITE_OTHER): Payer: BC Managed Care – PPO | Admitting: Ophthalmology

## 2012-01-22 DIAGNOSIS — H35349 Macular cyst, hole, or pseudohole, unspecified eye: Secondary | ICD-10-CM

## 2012-01-24 ENCOUNTER — Encounter (INDEPENDENT_AMBULATORY_CARE_PROVIDER_SITE_OTHER): Payer: BC Managed Care – PPO | Admitting: Ophthalmology

## 2012-01-29 ENCOUNTER — Ambulatory Visit (INDEPENDENT_AMBULATORY_CARE_PROVIDER_SITE_OTHER): Payer: BC Managed Care – PPO | Admitting: Ophthalmology

## 2012-01-29 DIAGNOSIS — H35349 Macular cyst, hole, or pseudohole, unspecified eye: Secondary | ICD-10-CM

## 2012-04-08 ENCOUNTER — Encounter (INDEPENDENT_AMBULATORY_CARE_PROVIDER_SITE_OTHER): Payer: BC Managed Care – PPO | Admitting: Ophthalmology

## 2012-04-08 DIAGNOSIS — H2510 Age-related nuclear cataract, unspecified eye: Secondary | ICD-10-CM

## 2012-04-08 DIAGNOSIS — H43819 Vitreous degeneration, unspecified eye: Secondary | ICD-10-CM

## 2012-04-08 DIAGNOSIS — H35349 Macular cyst, hole, or pseudohole, unspecified eye: Secondary | ICD-10-CM

## 2012-10-17 ENCOUNTER — Ambulatory Visit (INDEPENDENT_AMBULATORY_CARE_PROVIDER_SITE_OTHER): Payer: BC Managed Care – PPO | Admitting: Ophthalmology

## 2012-10-17 DIAGNOSIS — H2510 Age-related nuclear cataract, unspecified eye: Secondary | ICD-10-CM

## 2012-10-17 DIAGNOSIS — H35349 Macular cyst, hole, or pseudohole, unspecified eye: Secondary | ICD-10-CM

## 2012-10-17 DIAGNOSIS — I10 Essential (primary) hypertension: Secondary | ICD-10-CM

## 2012-10-17 DIAGNOSIS — H35039 Hypertensive retinopathy, unspecified eye: Secondary | ICD-10-CM

## 2012-10-17 DIAGNOSIS — H35379 Puckering of macula, unspecified eye: Secondary | ICD-10-CM

## 2012-10-17 DIAGNOSIS — H43819 Vitreous degeneration, unspecified eye: Secondary | ICD-10-CM

## 2013-01-09 ENCOUNTER — Other Ambulatory Visit: Payer: Self-pay

## 2015-08-27 DIAGNOSIS — H35371 Puckering of macula, right eye: Secondary | ICD-10-CM | POA: Diagnosis not present

## 2015-08-27 DIAGNOSIS — H35341 Macular cyst, hole, or pseudohole, right eye: Secondary | ICD-10-CM | POA: Diagnosis not present

## 2015-08-27 DIAGNOSIS — H26493 Other secondary cataract, bilateral: Secondary | ICD-10-CM | POA: Diagnosis not present

## 2015-08-27 DIAGNOSIS — Z961 Presence of intraocular lens: Secondary | ICD-10-CM | POA: Diagnosis not present

## 2015-12-02 DIAGNOSIS — Z0000 Encounter for general adult medical examination without abnormal findings: Secondary | ICD-10-CM | POA: Diagnosis not present

## 2015-12-08 DIAGNOSIS — F411 Generalized anxiety disorder: Secondary | ICD-10-CM | POA: Diagnosis not present

## 2015-12-08 DIAGNOSIS — Z23 Encounter for immunization: Secondary | ICD-10-CM | POA: Diagnosis not present

## 2015-12-08 DIAGNOSIS — I10 Essential (primary) hypertension: Secondary | ICD-10-CM | POA: Diagnosis not present

## 2015-12-08 DIAGNOSIS — E7800 Pure hypercholesterolemia, unspecified: Secondary | ICD-10-CM | POA: Diagnosis not present

## 2016-02-08 DIAGNOSIS — Z6827 Body mass index (BMI) 27.0-27.9, adult: Secondary | ICD-10-CM | POA: Diagnosis not present

## 2016-02-08 DIAGNOSIS — Z01419 Encounter for gynecological examination (general) (routine) without abnormal findings: Secondary | ICD-10-CM | POA: Diagnosis not present

## 2016-02-08 DIAGNOSIS — Z1231 Encounter for screening mammogram for malignant neoplasm of breast: Secondary | ICD-10-CM | POA: Diagnosis not present

## 2017-01-05 DIAGNOSIS — Z23 Encounter for immunization: Secondary | ICD-10-CM | POA: Diagnosis not present

## 2017-01-09 DIAGNOSIS — F411 Generalized anxiety disorder: Secondary | ICD-10-CM | POA: Diagnosis not present

## 2017-01-09 DIAGNOSIS — I10 Essential (primary) hypertension: Secondary | ICD-10-CM | POA: Diagnosis not present

## 2017-01-09 DIAGNOSIS — Z0000 Encounter for general adult medical examination without abnormal findings: Secondary | ICD-10-CM | POA: Diagnosis not present

## 2017-01-09 DIAGNOSIS — E7800 Pure hypercholesterolemia, unspecified: Secondary | ICD-10-CM | POA: Diagnosis not present

## 2017-02-08 DIAGNOSIS — H35341 Macular cyst, hole, or pseudohole, right eye: Secondary | ICD-10-CM | POA: Diagnosis not present

## 2017-02-08 DIAGNOSIS — H43822 Vitreomacular adhesion, left eye: Secondary | ICD-10-CM | POA: Diagnosis not present

## 2017-02-08 DIAGNOSIS — Z961 Presence of intraocular lens: Secondary | ICD-10-CM | POA: Diagnosis not present

## 2017-02-08 DIAGNOSIS — H35371 Puckering of macula, right eye: Secondary | ICD-10-CM | POA: Diagnosis not present

## 2017-02-15 DIAGNOSIS — H26493 Other secondary cataract, bilateral: Secondary | ICD-10-CM | POA: Diagnosis not present

## 2017-02-21 DIAGNOSIS — H26492 Other secondary cataract, left eye: Secondary | ICD-10-CM | POA: Diagnosis not present

## 2017-05-01 DIAGNOSIS — Z1231 Encounter for screening mammogram for malignant neoplasm of breast: Secondary | ICD-10-CM | POA: Diagnosis not present

## 2017-05-01 DIAGNOSIS — Z6827 Body mass index (BMI) 27.0-27.9, adult: Secondary | ICD-10-CM | POA: Diagnosis not present

## 2017-05-01 DIAGNOSIS — Z13820 Encounter for screening for osteoporosis: Secondary | ICD-10-CM | POA: Diagnosis not present

## 2017-05-01 DIAGNOSIS — Z01419 Encounter for gynecological examination (general) (routine) without abnormal findings: Secondary | ICD-10-CM | POA: Diagnosis not present

## 2017-07-03 DIAGNOSIS — H43812 Vitreous degeneration, left eye: Secondary | ICD-10-CM | POA: Diagnosis not present

## 2017-07-10 DIAGNOSIS — F411 Generalized anxiety disorder: Secondary | ICD-10-CM | POA: Diagnosis not present

## 2017-07-10 DIAGNOSIS — I10 Essential (primary) hypertension: Secondary | ICD-10-CM | POA: Diagnosis not present

## 2017-07-18 DIAGNOSIS — H43812 Vitreous degeneration, left eye: Secondary | ICD-10-CM | POA: Diagnosis not present

## 2017-11-29 DIAGNOSIS — Z23 Encounter for immunization: Secondary | ICD-10-CM | POA: Diagnosis not present

## 2018-02-05 DIAGNOSIS — Z0000 Encounter for general adult medical examination without abnormal findings: Secondary | ICD-10-CM | POA: Diagnosis not present

## 2018-05-13 DIAGNOSIS — Z1231 Encounter for screening mammogram for malignant neoplasm of breast: Secondary | ICD-10-CM | POA: Diagnosis not present

## 2018-05-13 DIAGNOSIS — Z6828 Body mass index (BMI) 28.0-28.9, adult: Secondary | ICD-10-CM | POA: Diagnosis not present

## 2018-05-13 DIAGNOSIS — Z01419 Encounter for gynecological examination (general) (routine) without abnormal findings: Secondary | ICD-10-CM | POA: Diagnosis not present

## 2018-08-06 DIAGNOSIS — F411 Generalized anxiety disorder: Secondary | ICD-10-CM | POA: Diagnosis not present

## 2018-08-06 DIAGNOSIS — I10 Essential (primary) hypertension: Secondary | ICD-10-CM | POA: Diagnosis not present

## 2018-08-24 DIAGNOSIS — Z20828 Contact with and (suspected) exposure to other viral communicable diseases: Secondary | ICD-10-CM | POA: Diagnosis not present

## 2018-12-24 DIAGNOSIS — Z23 Encounter for immunization: Secondary | ICD-10-CM | POA: Diagnosis not present

## 2019-01-02 DIAGNOSIS — Z23 Encounter for immunization: Secondary | ICD-10-CM | POA: Diagnosis not present

## 2019-02-04 DIAGNOSIS — E559 Vitamin D deficiency, unspecified: Secondary | ICD-10-CM | POA: Diagnosis not present

## 2019-02-04 DIAGNOSIS — I10 Essential (primary) hypertension: Secondary | ICD-10-CM | POA: Diagnosis not present

## 2019-02-04 DIAGNOSIS — Z0000 Encounter for general adult medical examination without abnormal findings: Secondary | ICD-10-CM | POA: Diagnosis not present

## 2019-02-04 DIAGNOSIS — F411 Generalized anxiety disorder: Secondary | ICD-10-CM | POA: Diagnosis not present

## 2019-02-04 DIAGNOSIS — E7800 Pure hypercholesterolemia, unspecified: Secondary | ICD-10-CM | POA: Diagnosis not present

## 2019-11-20 DIAGNOSIS — F411 Generalized anxiety disorder: Secondary | ICD-10-CM | POA: Diagnosis not present

## 2019-11-20 DIAGNOSIS — I10 Essential (primary) hypertension: Secondary | ICD-10-CM | POA: Diagnosis not present

## 2019-12-10 DIAGNOSIS — Z23 Encounter for immunization: Secondary | ICD-10-CM | POA: Diagnosis not present

## 2019-12-22 DIAGNOSIS — Z23 Encounter for immunization: Secondary | ICD-10-CM | POA: Diagnosis not present

## 2020-01-06 DIAGNOSIS — Z23 Encounter for immunization: Secondary | ICD-10-CM | POA: Diagnosis not present

## 2020-02-06 DIAGNOSIS — Z0000 Encounter for general adult medical examination without abnormal findings: Secondary | ICD-10-CM | POA: Diagnosis not present

## 2020-02-06 DIAGNOSIS — F411 Generalized anxiety disorder: Secondary | ICD-10-CM | POA: Diagnosis not present

## 2020-02-06 DIAGNOSIS — I10 Essential (primary) hypertension: Secondary | ICD-10-CM | POA: Diagnosis not present

## 2020-02-06 DIAGNOSIS — E559 Vitamin D deficiency, unspecified: Secondary | ICD-10-CM | POA: Diagnosis not present

## 2020-02-06 DIAGNOSIS — E7800 Pure hypercholesterolemia, unspecified: Secondary | ICD-10-CM | POA: Diagnosis not present

## 2020-02-06 DIAGNOSIS — M8580 Other specified disorders of bone density and structure, unspecified site: Secondary | ICD-10-CM | POA: Diagnosis not present

## 2020-02-18 DIAGNOSIS — Z20822 Contact with and (suspected) exposure to covid-19: Secondary | ICD-10-CM | POA: Diagnosis not present

## 2020-04-21 DIAGNOSIS — M722 Plantar fascial fibromatosis: Secondary | ICD-10-CM | POA: Diagnosis not present

## 2020-05-13 ENCOUNTER — Ambulatory Visit (INDEPENDENT_AMBULATORY_CARE_PROVIDER_SITE_OTHER): Payer: Medicare Other

## 2020-05-13 ENCOUNTER — Ambulatory Visit (INDEPENDENT_AMBULATORY_CARE_PROVIDER_SITE_OTHER): Payer: Medicare Other | Admitting: Podiatry

## 2020-05-13 ENCOUNTER — Encounter: Payer: Self-pay | Admitting: Podiatry

## 2020-05-13 ENCOUNTER — Other Ambulatory Visit: Payer: Self-pay

## 2020-05-13 DIAGNOSIS — M722 Plantar fascial fibromatosis: Secondary | ICD-10-CM

## 2020-05-13 MED ORDER — MELOXICAM 15 MG PO TABS: 15.0000 mg | ORAL_TABLET | Freq: Every day | ORAL | 3 refills | Status: DC

## 2020-05-13 NOTE — Patient Instructions (Signed)

## 2020-05-13 NOTE — Progress Notes (Signed)
Subjective:  Patient ID: Pamela Ross, female    DOB: September 22, 1955,  MRN: [id number] HPI Chief Complaint  Patient presents with  . Foot Pain    Plantar heel right - aching x 3-4 months, AM pain, wears arch binder (helps), tried Advil  . New Patient (Initial Visit)    65 y.o. female presents with the above complaint.   ROS: Denies fever chills nausea vomiting muscle aches pains calf pain back pain chest pain shortness of breath.  Past Medical History:  Diagnosis Date  . Anxiety   . Arthritis   . Hyperlipidemia   . Hypertension    Past Surgical History:  Procedure Laterality Date  . GAS INSERTION  12/26/2011   Procedure: INSERTION OF GAS;  Surgeon: Hayden Pedro, MD;  Location: [facility];  Service: Ophthalmology;  Laterality: Right;  . GUM SURGERY      Current Outpatient Medications:  .  meloxicam (MOBIC) 15 MG tablet, Take 1 tablet (15 mg total) by mouth daily., Disp: 30 tablet, Rfl: 3 .  ACAI BERRY PO, Take 1 tablet by mouth daily. , Disp: , Rfl:  .  bacitracin-polymyxin b (POLYSPORIN) ophthalmic ointment, Place 1 application into the right eye 4 (four) times daily. apply to eye every 12 hours while awake, Disp: 3.5 g, Rfl:  .  calcium carbonate (OS-CAL) 600 MG TABS, Take 600 mg by mouth 2 (two) times daily with a meal.  , Disp: , Rfl:  .  carvedilol (COREG) 6.25 MG tablet, Take 6.25 mg by mouth 2 (two) times daily with a meal. , Disp: , Rfl:  .  citalopram (CELEXA) 20 MG tablet, Take 1 tablet by mouth Daily. , Disp: , Rfl:  .  clonazePAM (KLONOPIN) 0.5 MG tablet, Take 0.5 mg by mouth at bedtime. , Disp: , Rfl:  .  irbesartan (AVAPRO) 300 MG tablet, Take 300 mg by mouth daily., Disp: , Rfl:  .  losartan (COZAAR) 100 MG tablet, Take 100 mg by mouth daily. Takes at 1400, Disp: , Rfl:  .  magnesium gluconate (MAGONATE) 500 MG tablet, Take 500 mg by mouth at bedtime. , Disp: , Rfl:  .  Multiple Vitamins-Minerals (MULTIVITAMIN PO), Take by mouth daily.  , Disp: , Rfl:  .   Omega-3 Fatty Acids (FISH OIL PO), Take 1 tablet by mouth 2 (two) times daily.  , Disp: , Rfl:  .  Zoster Vaccine Adjuvanted (SHINGRIX) injection, Shingrix (PF) 50 mcg/0.5 mL intramuscular suspension, kit, Disp: , Rfl:   Allergies  Allergen Reactions  . Codeine Nausea Only  . Erythromycin Diarrhea   Review of Systems Objective:  There were no vitals filed for this visit.  General: Well developed, nourished, in no acute distress, alert and oriented x3   Dermatological: Skin is warm, dry and supple bilateral. Nails x 10 are well maintained; remaining integument appears unremarkable at this time. There are no open sores, no preulcerative lesions, no rash or signs of infection present.  Vascular: Dorsalis Pedis artery and Posterior Tibial artery pedal pulses are 2/4 bilateral with immedate capillary fill time. Pedal hair growth present. No varicosities and no lower extremity edema present bilateral.   Neruologic: Grossly intact via light touch bilateral. Vibratory intact via tuning fork bilateral. Protective threshold with Semmes Wienstein monofilament intact to all pedal sites bilateral. Patellar and Achilles deep tendon reflexes 2+ bilateral. No Babinski or clonus noted bilateral.   Musculoskeletal: No gross boney pedal deformities bilateral. No pain, crepitus, or limitation noted with foot and ankle  range of motion bilateral. Muscular strength 5/5 in all groups tested bilateral.  She has pain on palpation medial canthal tubercle of the right heel.  She also has pain on palpation of the plantar calcaneal tubercle.  No pain on medial and lateral compression of the calcaneus.  Gait: Unassisted, Nonantalgic.    Radiographs:  Radiographs taken today demonstrate osseously mature individual small plantar distally oriented calcaneal heel spur with soft tissue increase in density plantar fascial kidney insertion site.  Achilles tendon appears to be of normal with in size and starting on posterior  aspect of the heel.  No fractures are identified.  No acute findings identified.  Assessment & Plan:   Assessment: Planter fasciitis right.  Plan: Discussed etiology pathology conservative versus surgical therapies.  Offered her an injection she declined relating that she feels that steroid because third hole in her retina.  She goes on to say that she will not take oral steroids as well.  She would like to try meloxicam 15 mg 1 p.o.daily and a plantar fascial brace.  We did discuss appropriate shoe gear stretching exercise ice therapy and shoe gear modifications and I will follow-up with her in 1 month     Max T. [place], [place]

## 2020-05-14 ENCOUNTER — Telehealth: Payer: Self-pay | Admitting: *Deleted

## 2020-05-14 NOTE — Telephone Encounter (Signed)
Patient was recently diagnosed with Plantar Fasciitis and has a few questions for the doctor. She would like to know if she can continue her  daily mile walks or should she be resting? Should she be rubbing the Voltaren get on just the heel or both heel and ankles? Will the Meloxicam prescribed increase blood pressure or cause heart problems. Please advise.

## 2020-05-17 NOTE — Telephone Encounter (Signed)
Caryl Pina please call this lady back and inform her that with any anti-inflammatories she will always need to watch her blood pressure and make sure that it does not cause her to increase.  If it does she needs to stop the medicine.  As far as the Voltaren, she needs to only apply it to the heel that is painful.  And she should decrease her activity level.

## 2020-05-19 NOTE — Telephone Encounter (Signed)
Called and explained message to patient per Dr Stephenie Acres note, verbalized understanding.

## 2020-06-09 ENCOUNTER — Telehealth: Payer: Self-pay | Admitting: *Deleted

## 2020-06-09 DIAGNOSIS — M722 Plantar fascial fibromatosis: Secondary | ICD-10-CM

## 2020-06-09 NOTE — Telephone Encounter (Signed)
Patient is requesting the Physical therapy referral,sent to [facility]).She has changed her mind.Please advise.

## 2020-06-09 NOTE — Telephone Encounter (Signed)
PT orders placed. Facility will contact patient for an appointment.

## 2020-06-15 ENCOUNTER — Ambulatory Visit: Payer: Medicare Other | Admitting: Podiatry

## 2020-06-25 DIAGNOSIS — Z23 Encounter for immunization: Secondary | ICD-10-CM | POA: Diagnosis not present

## 2020-07-06 ENCOUNTER — Other Ambulatory Visit: Payer: Self-pay

## 2020-07-06 ENCOUNTER — Ambulatory Visit: Payer: Medicare Other | Attending: Podiatry | Admitting: Physical Therapy

## 2020-07-06 ENCOUNTER — Encounter: Payer: Self-pay | Admitting: Physical Therapy

## 2020-07-06 DIAGNOSIS — R262 Difficulty in walking, not elsewhere classified: Secondary | ICD-10-CM | POA: Diagnosis not present

## 2020-07-06 DIAGNOSIS — M79671 Pain in right foot: Secondary | ICD-10-CM | POA: Insufficient documentation

## 2020-07-06 NOTE — Therapy (Signed)
[facility] [address], [place], [place], [address] Phone: [phone]   Fax:  [phone]  Physical Therapy Evaluation  Patient Details  Name: Pamela Ross MRN: [id number] Date of Birth: 12/21/1955 Referring Provider (PT): Tyson Dense, [place]   Encounter Date: 07/06/2020   PT End of Session - 07/06/20 1546    Visit Number 1    Date for PT Re-Evaluation 08/20/20    Authorization Type Medicare    Progress Note Due on Visit 10    PT Start Time 1106   Patient arriving late   PT Stop Time 1145    PT Time Calculation (min) 39 min    Activity Tolerance Patient tolerated treatment well;No increased pain    Behavior During Therapy WFL for tasks assessed/performed           Past Medical History:  Diagnosis Date  . Anxiety   . Arthritis   . Hyperlipidemia   . Hypertension     Past Surgical History:  Procedure Laterality Date  . GAS INSERTION  12/26/2011   Procedure: INSERTION OF GAS;  Surgeon: Hayden Pedro, MD;  Location: [facility];  Service: Ophthalmology;  Laterality: Right;  . GUM SURGERY      There were no vitals filed for this visit.    Subjective Assessment - 07/06/20 1110    Subjective Patient presenting due to Rt foot pain. States that pain began November 2021. Initially was resistant to PT but pain worsened after going to a wedding in [place]. Was still resistant to PT but decided to proceed. Was walking 5x/week for approx 1 mile but has had to decreased distance and frequency due to pain. Had been focused on standing more at home to not be as sedentary but has become more sedentary due to pain.    Limitations Standing;Walking    How long can you stand comfortably? 30 minutes    Patient Stated Goals decreased pain; return to regular walking for exercise    Currently in Pain? Yes    Pain Score 1     Pain Location Foot    Pain Orientation Right    Pain Descriptors / Indicators Aching    Pain Type  Chronic pain    Pain Onset More than a month ago    Pain Frequency Intermittent    Aggravating Factors  prolonged standing              [facility] PT Assessment - 07/06/20 0001      Assessment   Medical Diagnosis M72.2 (ICD-10-CM) - Plantar fasciitis of right foot    Referring Provider (PT) [place], DPM    Onset Date/Surgical Date 01/05/20    Hand Dominance Right    Prior Therapy Yes      Precautions   Precautions None      Restrictions   Weight Bearing Restrictions No      Balance Screen   Has the patient fallen in the past 6 months No    Has the patient had a decrease in activity level because of a fear of falling?  No    Is the patient reluctant to leave their home because of a fear of falling?  No      Prior Function   Level of Independence Independent    Vocation Part time employment    Engineer, mining; walking, standing, lifiting, carrying      Cognition   Overall Cognitive Status Within Functional Limits for tasks  assessed      ROM / Strength   AROM / PROM / Strength AROM;Strength      AROM   AROM Assessment Site Ankle    Right/Left Ankle Right;Left    Right Ankle Dorsiflexion -1    Right Ankle Plantar Flexion 50    Right Ankle Inversion 32    Right Ankle Eversion 30    Left Ankle Dorsiflexion 0    Left Ankle Plantar Flexion 50    Left Ankle Inversion 36    Left Ankle Eversion 24      Strength   Strength Assessment Site Ankle    Right/Left Ankle Right;Left                      Objective measurements completed on examination: See above findings.               PT Education - 07/06/20 1536    Education Details Access Code: [email or web address]    Person(s) Educated Patient    Methods Explanation;Demonstration;Tactile cues;Verbal cues;Handout    Comprehension Verbalized understanding;Returned demonstration;Verbal cues required;Tactile cues required            PT Short Term Goals - 07/06/20 1541      PT SHORT TERM GOAL #1    Title Patient will be independent with HEP for continued progression at home.    Time 3    Period Weeks    Status New    Target Date 07/27/20             PT Long Term Goals - 07/06/20 1543      PT LONG TERM GOAL #1   Title Patient will improve FOTO score to >/= 71 to indicate improved overall function    Baseline 62    Time 6    Period Weeks    Status New    Target Date 08/17/20      PT LONG TERM GOAL #2   Title Patient will return to previous exercise regiment of walking 1 mile 5 days/week with no increased foot pain to indicate resolution of symptoms.    Baseline 1/2 mile 3x/week    Time 6    Period Weeks    Status New    Target Date 08/17/20                  Plan - 07/06/20 1536    Clinical Impression Statement Patient is a 65 y/o female referred due to Rt foot pain. PMH includes arthritis. Patient reported activity limitations include prolonged sitting and prolonged ambulation. Patient also reports being unable to walk for exercise. She demonstrates no gross LE strength or AROM impairments. She exhibits increased supination in Rt stance phase while ambulating. Also noting impaired eccentric doriflexion bilaterally in stance phase. Would benefit from skilled therapeutic intervention to address impairments for improved mobility and to return to exercise for healthy lifestyle.    Personal Factors and Comorbidities Comorbidity 1    Comorbidities arthritis    Examination-Activity Limitations Locomotion Level;Sit    Examination-Participation Restrictions Community Activity;Meal Prep    Stability/Clinical Decision Making Stable/Uncomplicated    Clinical Decision Making Low    Rehab Potential Good    PT Frequency 1x / week    PT Duration 6 weeks    PT Treatment/Interventions ADLs/Self Care Home Management;Cryotherapy;Electrical Stimulation;Iontophoresis 4mg /ml Dexamethasone;Moist Heat;Gait training;Stair training;Functional mobility training;Therapeutic  activities;Therapeutic exercise;Neuromuscular re-education;Patient/family education;Manual techniques;Dry needling;Taping;Joint Manipulations    PT Next Visit Plan review HEP; begin foot intrinsice  training    PT Home Exercise Plan Access Code: [email or web address]    Consulted and Agree with Plan of Care Patient           Patient will benefit from skilled therapeutic intervention in order to improve the following deficits and impairments:  Abnormal gait,Decreased activity tolerance,Decreased endurance,Difficulty walking,Increased fascial restricitons,Pain  Visit Diagnosis: Pain in right foot - Plan: PT plan of care cert/re-cert  Difficulty in walking, not elsewhere classified - Plan: PT plan of care cert/re-cert     Problem List Patient Active Problem List   Diagnosis Date Noted  . 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, [place], [place], [address] Phone: [phone]   Fax:  [phone]  Name: Pamela Ross MRN: [id number] Date of Birth: Feb 20, 1956

## 2020-07-06 NOTE — Patient Instructions (Signed)
Access Code: [email or web address] URL: [email or web address] Date: 07/06/2020 Prepared by: Everardo All  Exercises Gastroc Stretch with Foot at [employer] - 2 x daily - 7 x weekly - 1 sets - 2 reps - 15s hold Arch Lifting - 2 x daily - 7 x weekly - 1 sets - 15 reps Seated Great Toe Extension - 2 x daily - 7 x weekly - 1 sets - 10 reps Seated Lesser Toes Extension - 2 x daily - 7 x weekly - 1 sets - 10 reps Standing Toe Dorsiflexion Stretch - 2 x daily - 7 x weekly - 1 sets - 10 reps Seated Toe Towel Scrunches - 2 x daily - 7 x weekly - 1 sets - 5 reps

## 2020-07-14 ENCOUNTER — Ambulatory Visit: Payer: Medicare Other | Admitting: Physical Therapy

## 2020-07-14 ENCOUNTER — Other Ambulatory Visit: Payer: Self-pay

## 2020-07-14 DIAGNOSIS — M79671 Pain in right foot: Secondary | ICD-10-CM | POA: Diagnosis not present

## 2020-07-14 DIAGNOSIS — R262 Difficulty in walking, not elsewhere classified: Secondary | ICD-10-CM

## 2020-07-14 NOTE — Therapy (Signed)
[facility] [address] [place], [place], [address] Phone: [phone]   Fax:  [phone]  Physical Therapy Treatment  Patient Details  Name: Pamela Ross MRN: [id number] Date of Birth: 1955/03/20 Referring Provider (PT): Tyson Dense, [place]   Encounter Date: 07/14/2020   PT End of Session - 07/14/20 1717    Visit Number 2    Date for PT Re-Evaluation 08/20/20    Authorization Type Medicare    Progress Note Due on Visit 10    PT Start Time 3710    PT Stop Time 1310    PT Time Calculation (min) 40 min    Activity Tolerance Patient tolerated treatment well;No increased pain    Behavior During Therapy WFL for tasks assessed/performed           Past Medical History:  Diagnosis Date  . Anxiety   . Arthritis   . Hyperlipidemia   . Hypertension     Past Surgical History:  Procedure Laterality Date  . GAS INSERTION  12/26/2011   Procedure: INSERTION OF GAS;  Surgeon: Hayden Pedro, MD;  Location: [facility];  Service: Ophthalmology;  Laterality: Right;  . GUM SURGERY      There were no vitals filed for this visit.   Subjective Assessment - 07/14/20 1714    Subjective Has been partially compliant with HEP. Has not had increased pain. Has increased walking regimen to 3/4 mile.    Limitations Standing;Walking    How long can you stand comfortably? 30 minutes    Patient Stated Goals decreased pain; return to regular walking for exercise    Currently in Pain? No/denies    Pain Onset More than a month ago                             [facility] Adult PT Treatment/Exercise - 07/14/20 0001      Manual Therapy   Manual Therapy Joint mobilization;Soft tissue mobilization    Joint Mobilization all MTP joints of Rt foot; medial/lateral calcaneal mobs    Soft tissue mobilization STM Rt foot intrinsics, medial arch, lateral arch, proximal gastroc      Ankle Exercises: Stretches   Gastroc Stretch  3 reps;20 seconds    Gastroc Stretch Limitations standing at staircase      Ankle Exercises: Standing   Rocker Board 1 minute    Other Standing Ankle Exercises black foam; feet together x 1 min    Other Standing Ankle Exercises black foam modified tandem Rt/Lt x30s each      Ankle Exercises: Seated   Toe Raise 15 reps   great toe only   Toe Raise Limitations x15 reps; digits 1-4 only    Other Seated Ankle Exercises medial arch lifts; x15 repetitions    Other Seated Ankle Exercises marble pick up                  PT Education - 07/14/20 1715    Education Details Max encouragement to be compliant with HEP    Person(s) Educated Patient    Methods Explanation    Comprehension Verbalized understanding            PT Short Term Goals - 07/14/20 1717      PT SHORT TERM GOAL #1   Title Patient will be independent with HEP for continued progression at home.    Time 3    Period Weeks  Status On-going   Patient reports partial compliance   Target Date 07/27/20             PT Long Term Goals - 07/06/20 1543      PT LONG TERM GOAL #1   Title Patient will improve FOTO score to >/= 71 to indicate improved overall function    Baseline 62    Time 6    Period Weeks    Status New    Target Date 08/17/20      PT LONG TERM GOAL #2   Title Patient will return to previous exercise regiment of walking 1 mile 5 days/week with no increased foot pain to indicate resolution of symptoms.    Baseline 1/2 mile 3x/week    Time 6    Period Weeks    Status New    Target Date 08/17/20                 Plan - 07/14/20 1715    Clinical Impression Statement Therapist noting increased crepitus with palpation to medial arch. Also noting decreased mobility of MTP joints. Patient reporting no increased pain at end of session. Would benefit form continued skilled intervention for improved activity tolerance and to return to exercise.    Personal Factors and Comorbidities Comorbidity  1    Comorbidities arthritis    Examination-Activity Limitations Locomotion Level;Sit    Examination-Participation Restrictions Community Activity;Meal Prep    Rehab Potential Good    PT Frequency 1x / week    PT Duration 6 weeks    PT Treatment/Interventions ADLs/Self Care Home Management;Cryotherapy;Electrical Stimulation;Iontophoresis 4mg /ml Dexamethasone;Moist Heat;Gait training;Stair training;Functional mobility training;Therapeutic activities;Therapeutic exercise;Neuromuscular re-education;Patient/family education;Manual techniques;Dry needling;Taping;Joint Manipulations    PT Next Visit Plan continue foot intrinsic training    PT Home Exercise Plan Access Code: [email or web address]    Consulted and Agree with Plan of Care Patient           Patient will benefit from skilled therapeutic intervention in order to improve the following deficits and impairments:  Abnormal gait,Decreased activity tolerance,Decreased endurance,Difficulty walking,Increased fascial restricitons,Pain  Visit Diagnosis: Pain in right foot  Difficulty in walking, not elsewhere classified     Problem List Patient Active Problem List   Diagnosis Date Noted  . 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, [place], [place], [address] Phone: [phone]   Fax:  [phone]  Name: Shametra Cumberland MRN: [id number] Date of Birth: 1955/10/07

## 2020-07-21 ENCOUNTER — Other Ambulatory Visit: Payer: Self-pay

## 2020-07-21 ENCOUNTER — Ambulatory Visit: Payer: Medicare Other | Admitting: Physical Therapy

## 2020-07-21 DIAGNOSIS — M79671 Pain in right foot: Secondary | ICD-10-CM

## 2020-07-21 DIAGNOSIS — R262 Difficulty in walking, not elsewhere classified: Secondary | ICD-10-CM | POA: Diagnosis not present

## 2020-07-21 NOTE — Therapy (Signed)
[facility] [address] [place], [place], [address] Phone: [phone]   Fax:  [phone]  Physical Therapy Treatment  Patient Details  Name: Pamela Ross MRN: [id number] Date of Birth: December 10, 1955 Referring Provider (PT): Tyson Dense, [place]   Encounter Date: 07/21/2020   PT End of Session - 07/21/20 1557    Visit Number 3    Date for PT Re-Evaluation 08/20/20    Authorization Type Medicare    Progress Note Due on Visit 10    PT Start Time 1232    PT Stop Time 1311    PT Time Calculation (min) 39 min    Activity Tolerance Patient tolerated treatment well;No increased pain    Behavior During Therapy WFL for tasks assessed/performed           Past Medical History:  Diagnosis Date  . Anxiety   . Arthritis   . Hyperlipidemia   . Hypertension     Past Surgical History:  Procedure Laterality Date  . GAS INSERTION  12/26/2011   Procedure: INSERTION OF GAS;  Surgeon: Hayden Pedro, MD;  Location: [facility];  Service: Ophthalmology;  Laterality: Right;  . GUM SURGERY      There were no vitals filed for this visit.   Subjective Assessment - 07/21/20 1229    Subjective "It's up and it's down"    Limitations Standing;Walking    How long can you stand comfortably? 30 minutes    Patient Stated Goals decreased pain; return to regular walking for exercise    Pain Score 2     Pain Location Foot    Pain Orientation Right    Pain Descriptors / Indicators Discomfort    Pain Type Chronic pain    Pain Onset More than a month ago    Pain Frequency Intermittent                             [facility] Adult PT Treatment/Exercise - 07/21/20 0001      Ankle Exercises: Stretches   Gastroc Stretch 3 reps;20 seconds    Gastroc Stretch Limitations using rockerboard      Ankle Exercises: Standing   Rocker Board 1 minute    Heel Walk (Round Trip) x2    Toe Walk (Round Trip) x2    Other Standing  Ankle Exercises black foam modified tandem; 2 x 60s; black foam full tandem; 2 x 60s    Other Standing Ankle Exercises forward step up on BOSU; 2 x 10 repetitions; hand hold x 1      Ankle Exercises: Seated   Towel Crunch 3 reps    Towel Inversion/Eversion 3 reps                    PT Short Term Goals - 07/21/20 1556      PT SHORT TERM GOAL #1   Title Patient will be independent with HEP for continued progression at home.    Time 3    Period Weeks    Status On-going    Target Date 07/27/20             PT Long Term Goals - 07/06/20 1543      PT LONG TERM GOAL #1   Title Patient will improve FOTO score to >/= 71 to indicate improved overall function    Baseline 62    Time 6    Period Weeks  Status New    Target Date 08/17/20      PT LONG TERM GOAL #2   Title Patient will return to previous exercise regiment of walking 1 mile 5 days/week with no increased foot pain to indicate resolution of symptoms.    Baseline 1/2 mile 3x/week    Time 6    Period Weeks    Status New    Target Date 08/17/20                 Plan - 07/21/20 1542    Clinical Impression Statement Patient reporting no increased pain throughout session. Continues to have most pain in the morning and the evening. Educating patient on use of heat in the morning and use of cold in the evening. Patient verbalizing understanding. Would benefit from continued skilled intervention for decreased pain and improved activity tolerance.    Personal Factors and Comorbidities Comorbidity 1    Comorbidities arthritis    Examination-Activity Limitations Locomotion Level;Sit    Examination-Participation Restrictions Community Activity;Meal Prep    Rehab Potential Good    PT Frequency 1x / week    PT Duration 6 weeks    PT Treatment/Interventions ADLs/Self Care Home Management;Cryotherapy;Electrical Stimulation;Iontophoresis 4mg /ml Dexamethasone;Moist Heat;Gait training;Stair training;Functional mobility  training;Therapeutic activities;Therapeutic exercise;Neuromuscular re-education;Patient/family education;Manual techniques;Dry needling;Taping;Joint Manipulations    PT Next Visit Plan progress foot/ankle    PT Home Exercise Plan Access Code: [email or web address]    Consulted and Agree with Plan of Care Patient           Patient will benefit from skilled therapeutic intervention in order to improve the following deficits and impairments:  Abnormal gait,Decreased activity tolerance,Decreased endurance,Difficulty walking,Increased fascial restricitons,Pain  Visit Diagnosis: Pain in right foot  Difficulty in walking, not elsewhere classified     Problem List Patient Active Problem List   Diagnosis Date Noted  . 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, [place], [place], [address] Phone: [phone]   Fax:  [phone]  Name: Pamela Ross MRN: [id number] Date of Birth: 12/07/1955

## 2020-07-27 ENCOUNTER — Other Ambulatory Visit: Payer: Self-pay

## 2020-07-27 ENCOUNTER — Ambulatory Visit: Payer: Medicare Other | Admitting: Physical Therapy

## 2020-07-27 DIAGNOSIS — R262 Difficulty in walking, not elsewhere classified: Secondary | ICD-10-CM

## 2020-07-27 DIAGNOSIS — M79671 Pain in right foot: Secondary | ICD-10-CM | POA: Diagnosis not present

## 2020-07-27 NOTE — Therapy (Signed)
[facility] [address] [place], [place], [address] Phone: [phone]   Fax:  [phone]  Physical Therapy Treatment  Patient Details  Name: Pamela Ross MRN: [id number] Date of Birth: 31-Oct-1955 Referring Provider (PT): Tyson Dense, [place]   Encounter Date: 07/27/2020   PT End of Session - 07/27/20 1643    Visit Number 4    Date for PT Re-Evaluation 08/20/20    Authorization Type Medicare    Progress Note Due on Visit 10    PT Start Time 3704    PT Stop Time 1310    PT Time Calculation (min) 39 min    Activity Tolerance Patient tolerated treatment well;No increased pain    Behavior During Therapy WFL for tasks assessed/performed           Past Medical History:  Diagnosis Date  . Anxiety   . Arthritis   . Hyperlipidemia   . Hypertension     Past Surgical History:  Procedure Laterality Date  . GAS INSERTION  12/26/2011   Procedure: INSERTION OF GAS;  Surgeon: Hayden Pedro, MD;  Location: [facility];  Service: Ophthalmology;  Laterality: Right;  . GUM SURGERY      There were no vitals filed for this visit.   Subjective Assessment - 07/27/20 1641    Subjective Increased pain yesterday but has grossly felt less pain.    Limitations Standing;Walking    How long can you stand comfortably? 30 minutes    Patient Stated Goals decreased pain; return to regular walking for exercise    Currently in Pain? Yes    Pain Score 1     Pain Location Foot    Pain Orientation Right    Pain Descriptors / Indicators Discomfort    Pain Type Chronic pain    Pain Onset More than a month ago    Pain Frequency Intermittent                             [facility] Adult PT Treatment/Exercise - 07/27/20 0001      Manual Therapy   Manual Therapy Joint mobilization;Soft tissue mobilization    Joint Mobilization all MTP joints of Rt foot; medial/lateral calcaneal mobs    Soft tissue mobilization STM Rt  foot intrinsics, medial arch, lateral arch, proximal gastroc      Ankle Exercises: Stretches   Plantar Fascia Stretch 2 reps;20 seconds   Rt foot only   Gastroc Stretch 2 reps;20 seconds    Gastroc Stretch Limitations using rockerboard      Ankle Exercises: Standing   Heel Raises 10 reps   bil LE raise to single limb lower; deficit heel raise x10 repetitions   Other Standing Ankle Exercises tandem black foam, 2x1 min; SLS black foam, 2 x 1 min    Other Standing Ankle Exercises forward step up BOSU x 10 repetitions; lateral step up BOSU x 10 repetitions   hand hold x 1     Ankle Exercises: Seated   Other Seated Ankle Exercises frozen bottle roll under foot x2 minutes                    PT Short Term Goals - 07/27/20 1642      PT SHORT TERM GOAL #1   Title Patient will be independent with HEP for continued progression at home.    Time 3    Period Weeks  Status Achieved    Target Date 07/27/20             PT Long Term Goals - 07/06/20 1543      PT LONG TERM GOAL #1   Title Patient will improve FOTO score to >/= 71 to indicate improved overall function    Baseline 62    Time 6    Period Weeks    Status New    Target Date 08/17/20      PT LONG TERM GOAL #2   Title Patient will return to previous exercise regiment of walking 1 mile 5 days/week with no increased foot pain to indicate resolution of symptoms.    Baseline 1/2 mile 3x/week    Time 6    Period Weeks    Status New    Target Date 08/17/20                 Plan - 07/27/20 1642    Clinical Impression Statement Patient reports increased pain yesterday but had noticed decreased pain overall. Requiring hand hold x2 during 75% of SLS activity. Would benefit from continued skilled intervention for improved standing and walking tolerance.    Personal Factors and Comorbidities Comorbidity 1    Comorbidities arthritis    Examination-Activity Limitations Locomotion Level;Sit     Examination-Participation Restrictions Community Activity;Meal Prep    Rehab Potential Good    PT Frequency 1x / week    PT Duration 6 weeks    PT Treatment/Interventions ADLs/Self Care Home Management;Cryotherapy;Electrical Stimulation;Iontophoresis 4mg /ml Dexamethasone;Moist Heat;Gait training;Stair training;Functional mobility training;Therapeutic activities;Therapeutic exercise;Neuromuscular re-education;Patient/family education;Manual techniques;Dry needling;Taping;Joint Manipulations    PT Next Visit Plan continue to progress foot/ankle stabilization    PT Home Exercise Plan Access Code: [email or web address]    Consulted and Agree with Plan of Care Patient           Patient will benefit from skilled therapeutic intervention in order to improve the following deficits and impairments:  Abnormal gait,Decreased activity tolerance,Decreased endurance,Difficulty walking,Increased fascial restricitons,Pain  Visit Diagnosis: Pain in right foot  Difficulty in walking, not elsewhere classified     Problem List Patient Active Problem List   Diagnosis Date Noted  . 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 [place], [place], [address] Phone: [phone]   Fax:  [phone]  Name: Pamela Ross MRN: [id number] Date of Birth: [birth year]

## 2020-07-28 ENCOUNTER — Encounter: Payer: Medicare Other | Admitting: Physical Therapy

## 2020-08-04 ENCOUNTER — Ambulatory Visit: Payer: Medicare Other | Attending: Podiatry | Admitting: Physical Therapy

## 2020-08-04 ENCOUNTER — Other Ambulatory Visit: Payer: Self-pay

## 2020-08-04 DIAGNOSIS — M79671 Pain in right foot: Secondary | ICD-10-CM | POA: Diagnosis not present

## 2020-08-04 DIAGNOSIS — R262 Difficulty in walking, not elsewhere classified: Secondary | ICD-10-CM | POA: Diagnosis not present

## 2020-08-04 NOTE — Therapy (Signed)
[facility] [address], [place], [place], [address] Phone: [phone]   Fax:  [phone]  Physical Therapy Treatment  Patient Details  Name: Pamela Ross MRN: [id number] Date of Birth: 03/28/1955 Referring Provider (PT): Tyson Dense, [place]   Encounter Date: 08/04/2020   PT End of Session - 08/04/20 1439    Visit Number 5    Date for PT Re-Evaluation 08/20/20    Authorization Type Medicare    Progress Note Due on Visit 10    PT Start Time 1101    PT Stop Time 1140    PT Time Calculation (min) 39 min    Activity Tolerance Patient tolerated treatment well;No increased pain    Behavior During Therapy WFL for tasks assessed/performed           Past Medical History:  Diagnosis Date  . Anxiety   . Arthritis   . Hyperlipidemia   . Hypertension     Past Surgical History:  Procedure Laterality Date  . GAS INSERTION  12/26/2011   Procedure: INSERTION OF GAS;  Surgeon: Hayden Pedro, MD;  Location: [place];  Service: Ophthalmology;  Laterality: Right;  . GUM SURGERY      There were no vitals filed for this visit.   Subjective Assessment - 08/04/20 1434    Subjective Had no heel pain from last session to Monday. Had mild heel pain Monday and Tuesday but this has since resolved.    Limitations Standing;Walking    How long can you stand comfortably? 30 minutes    Patient Stated Goals decreased pain; return to regular walking for exercise                             [facility] Adult PT Treatment/Exercise - 08/04/20 0001      Ankle Exercises: Stretches   Plantar Fascia Stretch 3 reps;20 seconds   using towel   Gastroc Stretch 3 reps;20 seconds    Gastroc Stretch Limitations using rockerboard      Ankle Exercises: Standing   SLS Lt foot on slider; sliding into hip abduction and extension x10 reps    Side Shuffle (Round Trip) green loop at feet; x15 feet Lt/Rt    Other Standing Ankle  Exercises tandem black foam, 3 x 60s; SLS black foam, Rt only, 5 x 10s    Other Standing Ankle Exercises forward step up BOSU x 12 repetitions; lateral step up BOSU x 12 repetitions   hand hold x1                 PT Education - 08/04/20 1435    Education Details tandem stance compliant surface, SLS firm surface, SLS with clock taps    Person(s) Educated Patient    Methods Explanation;Demonstration;Tactile cues;Verbal cues;Handout    Comprehension Verbalized understanding;Returned demonstration;Verbal cues required;Tactile cues required            PT Short Term Goals - 07/27/20 1642      PT SHORT TERM GOAL #1   Title Patient will be independent with HEP for continued progression at home.    Time 3    Period Weeks    Status Achieved    Target Date 07/27/20             PT Long Term Goals - 08/04/20 1438      PT LONG TERM GOAL #1   Title Patient will improve FOTO score to >/=  71 to indicate improved overall function    Baseline 62    Time 6    Period Weeks    Status On-going      PT LONG TERM GOAL #2   Title Patient will return to previous exercise regiment of walking 1 mile 5 days/week with no increased foot pain to indicate resolution of symptoms.    Baseline 1/2 mile 3x/week    Time 6    Period Weeks    Status On-going   1/2 mile 4-5x/week                Plan - 08/04/20 1435    Clinical Impression Statement Activity tolerance apparently improved as patient reporting no pain from last session until Monday. Patient demonstrates improved stability when performing tandem stance on foam as she maintained postural stability approx. 45 seconds before touching hands down for support. Noting increased firing of foot intrinsics with SLS indicating need for further strengthening. Would benefit from continued skilled intervention to address impairments for decreased pain and improved functional activity tolerance.    Personal Factors and Comorbidities Comorbidity 1     Comorbidities arthritis    Examination-Activity Limitations Locomotion Level;Sit    Examination-Participation Restrictions Community Activity;Meal Prep    Rehab Potential Good    PT Frequency 1x / week    PT Duration 6 weeks    PT Treatment/Interventions ADLs/Self Care Home Management;Cryotherapy;Electrical Stimulation;Iontophoresis 4mg /ml Dexamethasone;Moist Heat;Gait training;Stair training;Functional mobility training;Therapeutic activities;Therapeutic exercise;Neuromuscular re-education;Patient/family education;Manual techniques;Dry needling;Taping;Joint Manipulations    PT Next Visit Plan progress balance and focus on single limb and small BOS stabilization    PT Home Exercise Plan Access Code: [email or web address]    Consulted and Agree with Plan of Care Patient           Patient will benefit from skilled therapeutic intervention in order to improve the following deficits and impairments:  Abnormal gait,Decreased activity tolerance,Decreased endurance,Difficulty walking,Increased fascial restricitons,Pain  Visit Diagnosis: Pain in right foot  Difficulty in walking, not elsewhere classified     Problem List Patient Active Problem List   Diagnosis Date Noted  . 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 [place], [place], [address] Phone: [phone]   Fax:  [phone]  Name: Pamela Ross MRN: [id number] Date of Birth: 26-Mar-1955

## 2020-08-04 NOTE — Patient Instructions (Signed)
Tandem Stance on Foam Pad with Eyes Open - 1 x daily - 7 x weekly - 1 sets - 3 reps - 60s hold Single Leg Stance - 1 x daily - 7 x weekly - 1 sets - 5 reps - 10s hold Single Leg Balance with Clock Reach - 1 x daily - 7 x weekly - 1 sets - 5 reps

## 2020-08-11 ENCOUNTER — Other Ambulatory Visit: Payer: Self-pay

## 2020-08-11 ENCOUNTER — Ambulatory Visit: Payer: Medicare Other | Admitting: Physical Therapy

## 2020-08-11 DIAGNOSIS — R262 Difficulty in walking, not elsewhere classified: Secondary | ICD-10-CM

## 2020-08-11 DIAGNOSIS — M79671 Pain in right foot: Secondary | ICD-10-CM

## 2020-08-11 NOTE — Therapy (Signed)
[facility] [address] [place], [place], [address] Phone: [phone]   Fax:  [phone]  Physical Therapy Treatment  Patient Details  Name: Pamela Ross MRN: [id number] Date of Birth: 1955/04/12 Referring Provider (PT): Tyson Dense, [place]   Encounter Date: 08/11/2020   PT End of Session - 08/11/20 1707    Visit Number 6    Date for PT Re-Evaluation 08/20/20    Authorization Type Medicare    Authorization Time Period KX at 15 visits    Progress Note Due on Visit 10    PT Start Time 1102    PT Stop Time 1140    PT Time Calculation (min) 38 min    Activity Tolerance Patient tolerated treatment well;No increased pain    Behavior During Therapy WFL for tasks assessed/performed           Past Medical History:  Diagnosis Date  . Anxiety   . Arthritis   . Hyperlipidemia   . Hypertension     Past Surgical History:  Procedure Laterality Date  . GAS INSERTION  12/26/2011   Procedure: INSERTION OF GAS;  Surgeon: Hayden Pedro, MD;  Location: [place];  Service: Ophthalmology;  Laterality: Right;  . GUM SURGERY      There were no vitals filed for this visit.   Subjective Assessment - 08/11/20 1107    Subjective Feels that she is approximately 10% improved. Remains compliant with HEP. Continues to have pain upon first waking up in the morning. Resolves this with heat.    Limitations Standing;Walking    How long can you stand comfortably? 30 minutes    Patient Stated Goals decreased pain; return to regular walking for exercise    Currently in Pain? Yes    Pain Score 1     Pain Location Foot    Pain Orientation Right    Pain Descriptors / Indicators Discomfort                             [facility] Adult PT Treatment/Exercise - 08/11/20 0001      Manual Therapy   Manual Therapy Joint mobilization;Soft tissue mobilization    Joint Mobilization all MTP joints of Rt foot; medial/lateral  calcaneal mobs    Soft tissue mobilization STM Rt foot intrinsics, medial arch, lateral arch, proximal gastroc      Ankle Exercises: Standing   SLS Lt foot on slider; sliding into hip abduction and extension x10 reps    Other Standing Ankle Exercises tandem black foam, 3 x 60s; SLS black foam, Rt only, 5 x 10s      Ankle Exercises: Seated   Other Seated Ankle Exercises raquet ball self mobilization x 2 minutes    Other Seated Ankle Exercises great toe extension x15 reps; digit 2-5 isolated extension x15      Ankle Exercises: Stretches   Plantar Fascia Stretch 3 reps;20 seconds    Gastroc Stretch 3 reps;20 seconds                    PT Short Term Goals - 07/27/20 1642      PT SHORT TERM GOAL #1   Title Patient will be independent with HEP for continued progression at home.    Time 3    Period Weeks    Status Achieved    Target Date 07/27/20  PT Long Term Goals - 08/11/20 1707      PT LONG TERM GOAL #2   Title Patient will return to previous exercise regiment of walking 1 mile 5 days/week with no increased foot pain to indicate resolution of symptoms.    Baseline 1/2 mile 3x/week    Time 6    Period Weeks    Status On-going   1/2 - 3/4 mile 3-4x/week                Plan - 08/11/20 1705    Clinical Impression Statement Patient symptomology continues to fluctuate. Continues to have increased difficulty with foot/ankle stability in SLS. Would benefit from continued skilled intervention for decreased pain and improved activity tolerance.    Personal Factors and Comorbidities Comorbidity 1    Comorbidities arthritis    Examination-Activity Limitations Locomotion Level;Sit    Examination-Participation Restrictions Community Activity;Meal Prep    Rehab Potential Good    PT Frequency 1x / week    PT Duration 6 weeks    PT Treatment/Interventions ADLs/Self Care Home Management;Cryotherapy;Electrical Stimulation;Iontophoresis 4mg /ml  Dexamethasone;Moist Heat;Gait training;Stair training;Functional mobility training;Therapeutic activities;Therapeutic exercise;Neuromuscular re-education;Patient/family education;Manual techniques;Dry needling;Taping;Joint Manipulations    PT Next Visit Plan progress balance and focus on single limb and small BOS stabilization    PT Home Exercise Plan Access Code: [email or web address]    Consulted and Agree with Plan of Care Patient           Patient will benefit from skilled therapeutic intervention in order to improve the following deficits and impairments:  Abnormal gait,Decreased activity tolerance,Decreased endurance,Difficulty walking,Increased fascial restricitons,Pain  Visit Diagnosis: Pain in right foot  Difficulty in walking, not elsewhere classified     Problem List Patient Active Problem List   Diagnosis Date Noted  . 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, [place], [place], [address] Phone: [phone]   Fax:  [phone]  Name: Pamela Ross MRN: [id number] Date of Birth: June 28, 1955

## 2020-08-18 ENCOUNTER — Other Ambulatory Visit: Payer: Self-pay

## 2020-08-18 ENCOUNTER — Ambulatory Visit: Payer: Medicare Other | Admitting: Physical Therapy

## 2020-08-18 DIAGNOSIS — R262 Difficulty in walking, not elsewhere classified: Secondary | ICD-10-CM

## 2020-08-18 DIAGNOSIS — M79671 Pain in right foot: Secondary | ICD-10-CM | POA: Diagnosis not present

## 2020-08-18 NOTE — Patient Instructions (Signed)
Seated Figure 4 Ankle Inversion with Resistance - 1 x daily - 7 x weekly - 3 sets - 10 reps Single Leg Heel Raise with Counter Support - 1 x daily - 7 x weekly - 3 sets - 10 reps Seated Plantar Fascia Stretch - 2-3 x daily - 7 x weekly - 1 sets - 3 reps - 20s hold Seated Plantar Fascia Mobilization with Small Ball - 2-3 x daily - 7 x weekly - 1 sets - 1 reps - 2 min hold  Trigger Point Dry Needling  What is Trigger Point Dry Needling (DN)? DN is a physical therapy technique used to treat muscle pain and dysfunction. Specifically, DN helps deactivate muscle trigger points (muscle knots).  A thin filiform needle is used to penetrate the skin and stimulate the underlying trigger point. The goal is for a local twitch response (LTR) to occur and for the trigger point to relax. No medication of any kind is injected during the procedure.   What Does Trigger Point Dry Needling Feel Like?  The procedure feels different for each individual patient. Some patients report that they do not actually feel the needle enter the skin and overall the process is not painful. Very mild bleeding may occur. However, many patients feel a deep cramping in the muscle in which the needle was inserted. This is the local twitch response.   How Will I feel after the treatment? Soreness is normal, and the onset of soreness may not occur for a few hours. Typically this soreness does not last longer than two days.  Bruising is uncommon, however; ice can be used to decrease any possible bruising.  In rare cases feeling tired or nauseous after the treatment is normal. In addition, your symptoms may get worse before they get better, this period will typically not last longer than 24 hours.   What Can I do After My Treatment? Increase your hydration by drinking more water for the next 24 hours. You may place ice or heat on the areas treated that have become sore, however, do not use heat on inflamed or bruised areas. Heat often  brings more relief post needling. You can continue your regular activities, but vigorous activity is not recommended initially after the treatment for 24 hours. DN is best combined with other physical therapy such as strengthening, stretching, and other therapies.

## 2020-08-18 NOTE — Therapy (Signed)
[facility] [address] [place], [place], [address] Phone: [phone]   Fax:  [phone]  Physical Therapy Treatment  Patient Details  Name: Pamela Ross MRN: [id number] Date of Birth: Apr 16, 1955 Referring Provider (PT): Tyson Dense, [place]   Encounter Date: 08/18/2020   PT End of Session - 08/18/20 1658     Visit Number 7    Authorization Type Medicare    Progress Note Due on Visit 10    PT Start Time 0017    PT Stop Time 1135    PT Time Calculation (min) 32 min    Activity Tolerance Patient tolerated treatment well;No increased pain    Behavior During Therapy WFL for tasks assessed/performed             Past Medical History:  Diagnosis Date   Anxiety    Arthritis    Hyperlipidemia    Hypertension     Past Surgical History:  Procedure Laterality Date   GAS INSERTION  12/26/2011   Procedure: INSERTION OF GAS;  Surgeon: Hayden Pedro, MD;  Location: [place];  Service: Ophthalmology;  Laterality: Right;   GUM SURGERY      There were no vitals filed for this visit.   Subjective Assessment - 08/18/20 1642     Subjective Notices mild overall improvement. Remains compliant with HEP. Continues to use store bought orthotics rather than prescribed custom orthotics but plans to switch. Feels that standing tolerance is improved when wearing supportive footwear. Walked one mile last week and had increased pain after.    Limitations Standing;Walking    Patient Stated Goals decreased pain; return to regular walking for exercise    Currently in Pain? No/denies                [facility] PT Assessment - 08/18/20 0001       Assessment   Medical Diagnosis M72.2 (ICD-10-CM) - Plantar fasciitis of right foot    Referring Provider (PT) Tyson Dense, DPM    Onset Date/Surgical Date 01/05/20    Hand Dominance Right    Next MD Visit None    Prior Therapy Yes      Precautions   Precautions None       Restrictions   Weight Bearing Restrictions No      Prior Function   Level of Independence Independent    Vocation Part time employment    Engineer, mining; walking, standing, lifiting, carrying      Cognition   Overall Cognitive Status Within Functional Limits for tasks assessed      Observation/Other Assessments   Focus on Therapeutic Outcomes (FOTO)  63 (goal 71)   Previous 62     AROM   Right Ankle Dorsiflexion 0    Right Ankle Plantar Flexion 50                           [facility] Adult PT Treatment/Exercise - 08/18/20 0001       Ambulation/Gait   Ambulation/Gait Yes    Ambulation/Gait Assistance 7: Independent    Gait Pattern Step-through pattern;Decreased hip/knee flexion - right;Decreased hip/knee flexion - left      Ankle Exercises: Standing   Heel Raises Both;10 reps   with single limb lower Rt; single limb heel raise, x10 repetitions; Rt only   Toe Walk (Round Trip) --   heel/toe walking 4 x 20 feet     Ankle Exercises: Seated  Other Seated Ankle Exercises inversion, yellow tband; 2 x 12 reps      Ankle Exercises: Stretches   Plantar Fascia Stretch 3 reps;20 seconds    Gastroc Stretch 3 reps;20 seconds   using rockerboard                   PT Education - 08/18/20 1644     Education Details resisted inversion yellow tband, plantar fascia stretch, single limb heel raise with UE support    Person(s) Educated Patient    Methods Explanation;Demonstration;Tactile cues;Verbal cues;Handout    Comprehension Verbalized understanding;Returned demonstration;Verbal cues required;Tactile cues required              PT Short Term Goals - 08/18/20 1650       PT SHORT TERM GOAL #1   Title Patient will be independent with HEP for continued progression at home.    Time 3    Period Weeks    Status Achieved    Target Date 07/27/20               PT Long Term Goals - 08/18/20 1650       PT LONG TERM GOAL #1   Title Patient  will improve FOTO score to >/= 71 to indicate improved overall function    Baseline 62    Time 6    Period Weeks    Status Not Met   63     PT LONG TERM GOAL #2   Title Patient will return to previous exercise regiment of walking 1 mile 5 days/week with no increased foot pain to indicate resolution of symptoms.    Baseline 1/2 mile 3x/week    Time 6    Period Weeks    Status Not Met   3/4 mile approximately 3x/week                  Plan - 08/18/20 1645     Clinical Impression Statement Patient is a 65 y/o female referred due to plantar fasciitis of Rt foot. She reports improved activity tolerance as she is able to stand for meal prep without increased pain. Patient exhibits no gross ankle AROM impairments. She reports intermittent improvement with regards to community activity as she has been unable to return to prolonged community ambulation or exercise consistently without increased pain. Would benefit from further MD evaluation. Recommend d/c to HEP at this time.    Personal Factors and Comorbidities Comorbidity 1    Comorbidities arthritis    Examination-Activity Limitations Locomotion Level;Sit    Examination-Participation Restrictions Community Activity;Meal Prep    Rehab Potential Good    PT Frequency 1x / week    PT Duration 6 weeks    PT Treatment/Interventions ADLs/Self Care Home Management;Cryotherapy;Electrical Stimulation;Iontophoresis 58m/ml Dexamethasone;Moist Heat;Gait training;Stair training;Functional mobility training;Therapeutic activities;Therapeutic exercise;Neuromuscular re-education;Patient/family education;Manual techniques;Dry needling;Taping;Joint Manipulations    PT Next Visit Plan D/C to HEP    PT Home Exercise Plan Access Code: [email or web address]   Consulted and Agree with Plan of Care Patient             Patient will benefit from skilled therapeutic intervention in order to improve the following deficits and impairments:  Abnormal gait, Decreased  activity tolerance, Decreased endurance, Difficulty walking, Increased fascial restricitons, Pain  Visit Diagnosis: Pain in right foot  Difficulty in walking, not elsewhere classified     Problem List Patient Active Problem List   Diagnosis Date Noted   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 Patient goals were partially met. Patient is being discharged due to lack of progress.    Everardo All PT, DPT  08/18/20 4:59 PM   [facility] [address], [place], [place], [address] Phone: [phone]   Fax:  [phone]  Name: Pamela Ross MRN: [id number] Date of Birth: Oct 13, 1955

## 2020-08-26 DIAGNOSIS — M25559 Pain in unspecified hip: Secondary | ICD-10-CM | POA: Diagnosis not present

## 2020-08-26 DIAGNOSIS — F411 Generalized anxiety disorder: Secondary | ICD-10-CM | POA: Diagnosis not present

## 2020-09-28 DIAGNOSIS — M25551 Pain in right hip: Secondary | ICD-10-CM | POA: Diagnosis not present

## 2020-11-24 DIAGNOSIS — Z23 Encounter for immunization: Secondary | ICD-10-CM | POA: Diagnosis not present

## 2020-11-26 DIAGNOSIS — R1030 Lower abdominal pain, unspecified: Secondary | ICD-10-CM | POA: Diagnosis not present

## 2020-11-29 ENCOUNTER — Other Ambulatory Visit: Payer: Self-pay | Admitting: Family Medicine

## 2020-11-29 DIAGNOSIS — R1030 Lower abdominal pain, unspecified: Secondary | ICD-10-CM

## 2020-11-30 ENCOUNTER — Ambulatory Visit
Admission: RE | Admit: 2020-11-30 | Discharge: 2020-11-30 | Disposition: A | Discharge status: Home / Self Care | Payer: PRIVATE HEALTH INSURANCE | Source: Ambulatory Visit | Attending: Family Medicine | Admitting: Family Medicine

## 2020-11-30 ENCOUNTER — Inpatient Hospital Stay (HOSPITAL_COMMUNITY)
Admission: EM | Admit: 2020-11-30 | Discharge: 2020-12-02 | DRG: 373 | Disposition: A | Discharge status: Home / Self Care | Payer: Medicare Other | Attending: Surgery | Admitting: Surgery

## 2020-11-30 ENCOUNTER — Encounter (HOSPITAL_COMMUNITY): Payer: Self-pay

## 2020-11-30 ENCOUNTER — Other Ambulatory Visit: Payer: Self-pay

## 2020-11-30 DIAGNOSIS — R1031 Right lower quadrant pain: Secondary | ICD-10-CM | POA: Diagnosis not present

## 2020-11-30 DIAGNOSIS — R197 Diarrhea, unspecified: Secondary | ICD-10-CM | POA: Diagnosis not present

## 2020-11-30 DIAGNOSIS — K6389 Other specified diseases of intestine: Secondary | ICD-10-CM | POA: Diagnosis not present

## 2020-11-30 DIAGNOSIS — K3532 Acute appendicitis with perforation and localized peritonitis, without abscess: Secondary | ICD-10-CM | POA: Diagnosis not present

## 2020-11-30 DIAGNOSIS — K3533 Acute appendicitis with perforation and localized peritonitis, with abscess: Principal | ICD-10-CM | POA: Diagnosis present

## 2020-11-30 DIAGNOSIS — R1030 Lower abdominal pain, unspecified: Secondary | ICD-10-CM

## 2020-11-30 DIAGNOSIS — E785 Hyperlipidemia, unspecified: Secondary | ICD-10-CM | POA: Diagnosis present

## 2020-11-30 DIAGNOSIS — I10 Essential (primary) hypertension: Secondary | ICD-10-CM | POA: Diagnosis present

## 2020-11-30 DIAGNOSIS — Z87891 Personal history of nicotine dependence: Secondary | ICD-10-CM

## 2020-11-30 DIAGNOSIS — K36 Other appendicitis: Secondary | ICD-10-CM | POA: Diagnosis not present

## 2020-11-30 DIAGNOSIS — Z20822 Contact with and (suspected) exposure to covid-19: Secondary | ICD-10-CM | POA: Diagnosis present

## 2020-11-30 DIAGNOSIS — Z79899 Other long term (current) drug therapy: Secondary | ICD-10-CM

## 2020-11-30 LAB — COMPREHENSIVE METABOLIC PANEL
ALT: 20 U/L (ref 0–44)
AST: 16 U/L (ref 15–41)
Albumin: 3.6 g/dL (ref 3.5–5.0)
Alkaline Phosphatase: 59 U/L (ref 38–126)
Anion gap: 9 (ref 5–15)
BUN: 7 mg/dL — ABNORMAL LOW (ref 8–23)
CO2: 28 mmol/L (ref 22–32)
Calcium: 9.4 mg/dL (ref 8.9–10.3)
Chloride: 96 mmol/L — ABNORMAL LOW (ref 98–111)
Creatinine, Ser: 0.51 mg/dL (ref 0.44–1.00)
GFR, Estimated: >60 mL/min (ref >60)
Glucose, Bld: 103 mg/dL — ABNORMAL HIGH (ref 70–99)
Potassium: 4.2 mmol/L (ref 3.5–5.1)
Sodium: 133 mmol/L — ABNORMAL LOW (ref 135–145)
Total Bilirubin: 0.6 mg/dL (ref 0.3–1.2)
Total Protein: 7.4 g/dL (ref 6.5–8.1)

## 2020-11-30 LAB — CBC WITH DIFFERENTIAL/PLATELET
Abs Immature Granulocytes: 0.04 10*3/uL (ref 0.00–0.07)
Basophils Absolute: 0 10*3/uL (ref 0.0–0.1)
Basophils Relative: 0 %
Eosinophils Absolute: 0.1 10*3/uL (ref 0.0–0.5)
Eosinophils Relative: 1 %
HCT: 34.9 % — ABNORMAL LOW (ref 36.0–46.0)
Hemoglobin: 12 g/dL (ref 12.0–15.0)
Immature Granulocytes: 0 %
Lymphocytes Relative: 12 %
Lymphs Abs: 1.3 10*3/uL (ref 0.7–4.0)
MCH: 32.5 pg (ref 26.0–34.0)
MCHC: 34.4 g/dL (ref 30.0–36.0)
MCV: 94.6 fL (ref 80.0–100.0)
Monocytes Absolute: 1.2 10*3/uL — ABNORMAL HIGH (ref 0.1–1.0)
Monocytes Relative: 11 %
Neutro Abs: 8 10*3/uL — ABNORMAL HIGH (ref 1.7–7.7)
Neutrophils Relative %: 76 %
Platelets: 245 10*3/uL (ref 150–400)
RBC: 3.69 MIL/uL — ABNORMAL LOW (ref 3.87–5.11)
RDW: 11.9 % (ref 11.5–15.5)
WBC: 10.6 10*3/uL — ABNORMAL HIGH (ref 4.0–10.5)
nRBC: 0 % (ref 0.0–0.2)

## 2020-11-30 LAB — URINALYSIS, ROUTINE W REFLEX MICROSCOPIC
Bacteria, UA: NONE SEEN
Bilirubin Urine: NEGATIVE
Glucose, UA: NEGATIVE mg/dL
Leukocytes,Ua: NEGATIVE
Nitrite: NEGATIVE
Protein, ur: NEGATIVE mg/dL
Specific Gravity, Urine: <1.005 — ABNORMAL LOW (ref 1.005–1.030)
pH: 7 (ref 5.0–8.0)

## 2020-11-30 LAB — RESP PANEL BY RT-PCR (FLU A&B, COVID) ARPGX2
Influenza A by PCR: NEGATIVE
Influenza B by PCR: NEGATIVE
SARS Coronavirus 2 by RT PCR: NEGATIVE

## 2020-11-30 LAB — LIPASE, BLOOD: Lipase: 22 U/L (ref 11–51)

## 2020-11-30 MED ORDER — IRBESARTAN 300 MG PO TABS: 300.0000 mg | ORAL_TABLET | Freq: Every day | ORAL | Status: DC

## 2020-11-30 MED ORDER — METRONIDAZOLE 500 MG/100ML IV SOLN
500.0000 mg | Freq: Once | INTRAVENOUS | Status: AC
  Administered 2020-11-30: 500 mg via INTRAVENOUS
  Filled 2020-11-30: qty 100

## 2020-11-30 MED ORDER — DIPHENHYDRAMINE HCL 12.5 MG/5ML PO ELIX: 12.5000 mg | ORAL_SOLUTION | Freq: Four times a day (QID) | ORAL | Status: DC | PRN

## 2020-11-30 MED ORDER — PIPERACILLIN-TAZOBACTAM 3.375 G IVPB
3.3750 g | Freq: Three times a day (TID) | INTRAVENOUS | Status: DC
  Administered 2020-11-30 – 2020-12-02 (×5): 3.375 g via INTRAVENOUS
  Filled 2020-11-30 (×5): qty 50

## 2020-11-30 MED ORDER — ACETAMINOPHEN 650 MG RE SUPP: 650.0000 mg | Freq: Four times a day (QID) | RECTAL | Status: DC | PRN

## 2020-11-30 MED ORDER — IOPAMIDOL (ISOVUE-300) INJECTION 61%
100.0000 mL | Freq: Once | INTRAVENOUS | Status: AC | PRN
  Administered 2020-11-30: 100 mL via INTRAVENOUS

## 2020-11-30 MED ORDER — IRBESARTAN 150 MG PO TABS
300.0000 mg | ORAL_TABLET | Freq: Every day | ORAL | Status: DC
  Filled 2020-11-30: qty 2
  Filled 2020-11-30: qty 1

## 2020-11-30 MED ORDER — CITALOPRAM HYDROBROMIDE 20 MG PO TABS
20.0000 mg | ORAL_TABLET | Freq: Every day | ORAL | Status: DC
  Filled 2020-11-30: qty 1

## 2020-11-30 MED ORDER — HYDROCODONE-ACETAMINOPHEN 5-325 MG PO TABS: 1.0000 | ORAL_TABLET | ORAL | Status: DC | PRN

## 2020-11-30 MED ORDER — CLONAZEPAM 0.5 MG PO TABS
0.5000 mg | ORAL_TABLET | Freq: Every day | ORAL | Status: DC
  Filled 2020-11-30 (×2): qty 1

## 2020-11-30 MED ORDER — CITALOPRAM HYDROBROMIDE 10 MG PO TABS
20.0000 mg | ORAL_TABLET | Freq: Every day | ORAL | Status: DC
  Filled 2020-11-30: qty 2

## 2020-11-30 MED ORDER — PIPERACILLIN-TAZOBACTAM 3.375 G IVPB
3.3750 g | Freq: Three times a day (TID) | INTRAVENOUS | Status: DC
  Filled 2020-11-30: qty 50

## 2020-11-30 MED ORDER — DIPHENHYDRAMINE HCL 50 MG/ML IJ SOLN: 12.5000 mg | Freq: Four times a day (QID) | INTRAMUSCULAR | Status: DC | PRN

## 2020-11-30 MED ORDER — SODIUM CHLORIDE 0.9 % IV SOLN
2.0000 g | Freq: Once | INTRAVENOUS | Status: AC
  Administered 2020-11-30: 2 g via INTRAVENOUS
  Filled 2020-11-30: qty 20

## 2020-11-30 MED ORDER — ENOXAPARIN SODIUM 40 MG/0.4ML IJ SOSY: 40.0000 mg | PREFILLED_SYRINGE | INTRAMUSCULAR | Status: DC

## 2020-11-30 MED ORDER — HYDROMORPHONE HCL 1 MG/ML IJ SOLN
1.0000 mg | INTRAMUSCULAR | Status: DC | PRN
Start: 2020-11-30 — End: 2020-12-02

## 2020-11-30 MED ORDER — CARVEDILOL 6.25 MG PO TABS
6.2500 mg | ORAL_TABLET | Freq: Two times a day (BID) | ORAL | Status: DC
  Filled 2020-11-30 (×2): qty 1

## 2020-11-30 MED ORDER — ONDANSETRON 4 MG PO TBDP: 4.0000 mg | ORAL_TABLET | Freq: Four times a day (QID) | ORAL | Status: DC | PRN

## 2020-11-30 MED ORDER — POTASSIUM CHLORIDE IN NACL 20-0.9 MEQ/L-% IV SOLN
INTRAVENOUS | Status: DC
  Filled 2020-11-30 (×2): qty 1000

## 2020-11-30 MED ORDER — ONDANSETRON HCL 4 MG/2ML IJ SOLN: 4.0000 mg | Freq: Four times a day (QID) | INTRAMUSCULAR | Status: DC | PRN

## 2020-11-30 MED ORDER — ACETAMINOPHEN 325 MG PO TABS: 650.0000 mg | ORAL_TABLET | Freq: Four times a day (QID) | ORAL | Status: DC | PRN

## 2020-11-30 NOTE — ED Provider Notes (Signed)
[facility] DEPT Provider Note   CSN: [id number] Arrival date & time: 11/30/20  1521     History Chief Complaint  Patient presents with   Abdominal Pain    Appendicitis- chronic     Pamela Ross is a 65 y.o. female.  Patient is a 65 year old female with a history of hypertension, hyperlipidemia and anxiety who is presenting today due to an abnormal CAT scan.  Patient reports that for the last 2 weeks she has had abdominal cramping, diarrhea, bloating and generally just not feeling very well.  It always seems to be worse in the mornings but has been fairly persistent every day.  She denies any blood in her stool.  No nausea or vomiting.  Significant anorexia and cannot report if food makes the pain worse because she is really not been eating.  She saw her doctor's office on Friday 5 days prior to arrival because of the discomfort.  Her doctor wanted to do a CT at that time but the patient was hesitant.  However on Sunday she stopped having diarrhea because she took Imodium but then started having low-grade temperatures of 100 for the last 2 days and had a CAT scan done today that showed chronic appendicitis with microperforation and she was instructed to come to the emergency room.  No prior abdominal surgeries.  No chest pain, cough, shortness of breath.  No recent travel outside the [place] and no antibiotic use in the last several months.  She still complains of just a crampy discomfort throughout her abdomen, bloating and poor appetite.  No radiation of pain.  Nothing seems to make it particularly worse.  The history is provided by the patient and medical records.  Abdominal Pain     Past Medical History:  Diagnosis Date   Anxiety    Arthritis    Hyperlipidemia    Hypertension     Patient Active Problem List   Diagnosis Date Noted   Perforated appendicitis 11/30/2020   Macular hole 12/15/2011    Past Surgical History:  Procedure  Laterality Date   GAS INSERTION  12/26/2011   Procedure: INSERTION OF GAS;  Surgeon: Hayden Pedro, MD;  Location: [facility];  Service: Ophthalmology;  Laterality: Right;   GUM SURGERY       OB History   No obstetric history on file.     Family History  Problem Relation Age of Onset   Colon cancer Neg Hx    Esophageal cancer Neg Hx    Stomach cancer Neg Hx    Rectal cancer Neg Hx     Social History   Tobacco Use   Smoking status: Former   Smokeless tobacco: Never   Tobacco comments:    using e-cig  Substance Use Topics   Alcohol use: Yes    Alcohol/week: 7.0 standard drinks    Types: 7 Glasses of wine per week   Drug use: No    Home Medications Prior to Admission medications   Medication Sig Start Date End Date Taking? Authorizing Provider  carvedilol (COREG) 6.25 MG tablet Take 6.25 mg by mouth 2 (two) times daily with a meal.    Yes [provider]  citalopram (CELEXA) 20 MG tablet Take 1 tablet by mouth Daily.  02/08/11  Yes [provider]  clonazePAM (KLONOPIN) 0.5 MG tablet Take 0.5 mg by mouth at bedtime.    Yes [provider]  irbesartan (AVAPRO) 300 MG tablet Take 300 mg by  mouth daily. 03/16/20  Yes [provider]  magnesium gluconate (MAGONATE) 500 MG tablet Take 500 mg by mouth at bedtime.    Yes [provider]  Melatonin 5 MG CAPS Take 5 mg by mouth at bedtime.   Yes [provider]  Valerian 500 MG CAPS Take 1 capsule by mouth at bedtime.   Yes [provider]  ACAI BERRY PO Take 1 tablet by mouth daily.  Patient not taking: No sig reported    [provider]  bacitracin-polymyxin b (POLYSPORIN) ophthalmic ointment Place 1 application into the right eye 4 (four) times daily. apply to eye every 12 hours while awake Patient not taking: No sig reported 12/27/11   Hayden Pedro, MD  calcium carbonate (OS-CAL) 600 MG TABS Take 600 mg by mouth 2 (two) times daily with a meal.   Patient  not taking: No sig reported    [provider]  losartan (COZAAR) 100 MG tablet Take 100 mg by mouth daily. Takes at 1400 Patient not taking: No sig reported    [provider]  meloxicam (MOBIC) 15 MG tablet Take 1 tablet (15 mg total) by mouth daily. Patient not taking: No sig reported 05/13/20   Hyatt, Max T, DPM  Multiple Vitamins-Minerals (MULTIVITAMIN PO) Take by mouth daily.   Patient not taking: No sig reported    [provider]  Zoster Vaccine Adjuvanted [facility]) injection Shingrix (PF) 50 mcg/0.5 mL intramuscular suspension, kit Patient not taking: Reported on 11/30/2020    [provider]    Allergies    Codeine and Erythromycin  Review of Systems   Review of Systems  Gastrointestinal:  Positive for abdominal pain.  All other systems reviewed and are negative.  Physical Exam Updated Vital Signs BP 122/67   Pulse 71   Temp 99.2 F (37.3 C) (Oral)   Resp 16   Ht $R'5\' 3"'SQ$  (1.6 m)   Wt 69.4 kg   SpO2 97%   BMI 27.10 kg/m   Physical Exam Vitals and nursing note reviewed.  Constitutional:      General: She is not in acute distress.    Appearance: Normal appearance. She is well-developed and normal weight.  HENT:     Head: Normocephalic and atraumatic.     Mouth/Throat:     Mouth: Mucous membranes are moist.  Eyes:     Conjunctiva/sclera: Conjunctivae normal.     Pupils: Pupils are equal, round, and reactive to light.  Cardiovascular:     Rate and Rhythm: Normal rate and regular rhythm.     Heart sounds: No murmur heard. Pulmonary:     Effort: Pulmonary effort is normal. No respiratory distress.     Breath sounds: Normal breath sounds. No wheezing or rales.  Abdominal:     General: Abdomen is flat. Bowel sounds are decreased. There is no distension.     Palpations: Abdomen is soft.     Tenderness: There is abdominal tenderness in the right lower quadrant. There is guarding. There is no right CVA tenderness, left CVA tenderness  or rebound.  Musculoskeletal:        General: No tenderness. Normal range of motion.     Cervical back: Normal range of motion and neck supple.  Skin:    General: Skin is warm and dry.     Findings: No erythema or rash.  Neurological:     Mental Status: She is alert and oriented to person, place, and time. Mental status is at baseline.  Psychiatric:        Mood and Affect: Mood normal.        Behavior: Behavior normal.    ED Results / Procedures / Treatments   Labs (all labs ordered are listed, but only abnormal results are displayed) Labs Reviewed  CBC WITH DIFFERENTIAL/PLATELET - Abnormal; Notable for the following components:      Result Value   WBC 10.6 (*)    RBC 3.69 (*)    HCT 34.9 (*)    Neutro Abs 8.0 (*)    Monocytes Absolute 1.2 (*)    All other components within normal limits  COMPREHENSIVE METABOLIC PANEL - Abnormal; Notable for the following components:   Sodium 133 (*)    Chloride 96 (*)    Glucose, Bld 103 (*)    BUN 7 (*)    All other components within normal limits  URINALYSIS, ROUTINE W REFLEX MICROSCOPIC - Abnormal; Notable for the following components:   Specific Gravity, Urine <1.005 (*)    Hgb urine dipstick TRACE (*)    Ketones, ur TRACE (*)    All other components within normal limits  RESP PANEL BY RT-PCR (FLU A&B, COVID) ARPGX2  LIPASE, BLOOD  CA 125  CEA  CBC  HIV ANTIBODY (ROUTINE TESTING W REFLEX)    EKG None  Radiology CT ABDOMEN PELVIS W CONTRAST  Result Date: 11/30/2020 CLINICAL DATA:  Diarrhea for 2 weeks with gas and bloating. No history of cancer. EXAM: CT ABDOMEN AND PELVIS WITH CONTRAST TECHNIQUE: Multidetector CT imaging of the abdomen and pelvis was performed using the standard protocol following bolus administration of intravenous contrast. CONTRAST:  160m ISOVUE-300 IOPAMIDOL (ISOVUE-300) INJECTION 61% COMPARISON:  None. FINDINGS: Lower chest: Anterior bibasilar scarring. Mild centrilobular emphysema. Normal heart size  without pericardial or pleural effusion. Hepatobiliary: Normal liver. Normal gallbladder, without biliary ductal dilatation. Pancreas: Normal, without mass or ductal dilatation. Spleen: Normal in size, without focal abnormality. Adrenals/Urinary Tract: Normal adrenal glands. Normal kidneys, without hydronephrosis. Decompressed urinary bladder. Stomach/Bowel: Normal stomach, without wall thickening. Normal colon. Terminal ileum is normal on 60/2. The appendix originates on 62/2 and coronal image 46. Measures on the order of 1.0 cm proximally including on 61/2. Extends cephalad to where its tip surrounded by an amorphous area of inflammation and soft tissue fullness, including at 4.8 x 4.6 cm on 57/2 and coronal image 54. This inflammatory mass is adjacent to the right ovary, which is identified on 59/2 and just lateral and superior to the distal ileum, which is mildly edematous on 62/2. Otherwise normal small bowel. Vascular/Lymphatic: Aortic atherosclerosis. Duplicated IVC. No abdominopelvic adenopathy. Reproductive: Normal uterus and left adnexa. Other: Trace cul-de-sac fluid on 60/2. No free intraperitoneal air. Mild pelvic floor laxity. Musculoskeletal: No acute osseous abnormality. Transitional L5 vertebral body. IMPRESSION: 1. Heterogeneous right lower quadrant inflammatory soft tissue mass, surrounding the appendiceal tip in the setting of proximal appendiceal enlargement. Favor chronic appendicitis with microperforation and surrounding phlegmon. This inflammatory mass is immediately adjacent to a mildly thickened distal ileum, likely secondarily. The subjacent right ovary is felt unlikely to represent the epicenter. 2. Trace cul-de-sac fluid, likely secondary. 3. Aortic atherosclerosis (ICD10-I70.0) and emphysema (ICD10-J43.9). These results will be called to the ordering clinician or representative by the Radiologist Assistant, and communication documented in the PACS or [employer] Electronically  Signed   By: KAbigail MiyamotoM.D.   On: 11/30/2020 14:00    Procedures Procedures   Medications Ordered in ED Medications  carvedilol (COREG) tablet 6.25 mg (  has no administration in time range)  clonazePAM (KLONOPIN) tablet 0.5 mg (0.5 mg Oral Patient Refused/Not Given 11/30/20 2201)  enoxaparin (LOVENOX) injection 40 mg (has no administration in time range)  acetaminophen (TYLENOL) tablet 650 mg (has no administration in time range)    Or  acetaminophen (TYLENOL) suppository 650 mg (has no administration in time range)  HYDROcodone-acetaminophen (NORCO/VICODIN) 5-325 MG per tablet 1-2 tablet (has no administration in time range)  HYDROmorphone (DILAUDID) injection 1 mg (has no administration in time range)  diphenhydrAMINE (BENADRYL) 12.5 MG/5ML elixir 12.5 mg (has no administration in time range)    Or  diphenhydrAMINE (BENADRYL) injection 12.5 mg (has no administration in time range)  ondansetron (ZOFRAN-ODT) disintegrating tablet 4 mg (has no administration in time range)    Or  ondansetron (ZOFRAN) injection 4 mg (has no administration in time range)  0.9 % NaCl with KCl 20 mEq/ L  infusion ( Intravenous New Bag/Given 12/01/20 0011)  piperacillin-tazobactam (ZOSYN) IVPB 3.375 g (3.375 g Intravenous New Bag/Given 11/30/20 2200)  citalopram (CELEXA) tablet 20 mg (has no administration in time range)  irbesartan (AVAPRO) tablet 300 mg (300 mg Oral Patient Refused/Not Given 12/01/20 0019)  cefTRIAXone (ROCEPHIN) 2 g in sodium chloride 0.9 % 100 mL IVPB (0 g Intravenous Stopped 11/30/20 1943)    And  metroNIDAZOLE (FLAGYL) IVPB 500 mg (0 mg Intravenous Stopped 11/30/20 1944)    ED Course  I have reviewed the triage vital signs and the nursing notes.  Pertinent labs & imaging results that were available during my care of the patient were reviewed by me and considered in my medical decision making (see chart for details).    MDM Rules/Calculators/A&P                           Patient is  a 65 year old female with 2 weeks of abdominal discomfort, bloating and diarrhea who had a CT today that showed heterogeneous right lower quadrant inflammatory soft tissue mass surrounding the appendiceal tip in the setting of proximal appendiceal enlargement.  They favor chronic appendicitis with microperforation and surrounding phlegmon.  This inflammatory mass is immediately adjacent to the mildly thickened distal ileum likely secondary.  They do not feel that the right ovary is the source at this time.  Patient does have right lower quadrant pain on exam but no other significant abdominal pain.  CBC with leukocytosis of 10 and normal hemoglobin.  CMP with normal renal function and anion gap, LFTs and total bilirubin are normal.  Lipase within normal limits.  Patient covered with Rocephin and Flagyl.  Will discuss with general surgery.  Final Clinical Impression(s) / ED Diagnoses Final diagnoses:  Acute appendicitis with perforation and localized peritonitis, without gangrene, unspecified whether abscess present    Rx / DC Orders ED Discharge Orders     None        Blanchie Dessert, MD 12/01/20 [phone]

## 2020-11-30 NOTE — ED Provider Notes (Signed)
Emergency Medicine Provider Triage Evaluation Note  Pamela Ross , a 65 y.o. female  was evaluated in triage.  Pt complains of abd pain x2 weeks. Pcp ct scan which showed appendicitis with possible microperforation.  Review of Systems  Positive: Abd pain Negative: diarrhea  Physical Exam  BP 136/69 (BP Location: Left Arm)   Pulse 69   Temp 99.2 F (37.3 C) (Oral)   Resp 18   Ht 5\' 3"  (1.6 m)   Wt 69.4 kg   SpO2 100%   BMI 27.10 kg/m  Gen:   Awake, no distress   Resp:  Normal effort MSK:   Moves extremities without difficulty   Medical Decision Making  Medically screening exam initiated at 3:48 PM.  Appropriate orders placed.  Pamela Ross was informed that the remainder of the evaluation will be completed by another provider, this initial triage assessment does not replace that evaluation, and the importance of remaining in the ED until their evaluation is complete.     Bishop Dublin 11/30/20 1549    Teressa Lower, MD 11/30/20 1756

## 2020-11-30 NOTE — ED Triage Notes (Addendum)
Pt reports she had a CT of abdomen earlier today and was told it was chronic appendicitis. Worried for perforation. C/O diarrhea and lower abd. X2 weeks.   3/10 mid lower abdomen   Took 2 tylenol around 2:30 pm  Denies n/v at this time.   A/Ox4 Ambulatory in triage.

## 2020-11-30 NOTE — H&P (Signed)
HPI: Pamela Ross is an 65 y.o. female who is here for abdominal pain  This is a pleasant 65 year old female who started having some abdominal cramping with bloating and diarrhea approximately 2 weeks ago.  Most of the loose bowel moods were in the morning.  This slowly improved and she had improvement of diarrhea with Imodium.  She had no nausea or vomiting.  She had no appetite at first this is now returned.  She saw her primary care provider last Friday and a CAT scan was recommended.  Over the weekend she started having low-grade temperatures with the highest being 100.  She underwent a CT scan today which showed findings worrisome for possible perforated appendicitis so she was sent to the emergency department. Currently, she reports no abdominal pain.  She is hungry.  She still has had some bloating. She has no similar history of this discomfort.  Her last colonoscopy in 2013 showed benign polyps.  She has no dysuria.  Past Medical History:  Diagnosis Date   Anxiety    Arthritis    Hyperlipidemia    Hypertension     Past Surgical History:  Procedure Laterality Date   GAS INSERTION  12/26/2011   Procedure: INSERTION OF GAS;  Surgeon: Hayden Pedro, MD;  Location: [facility];  Service: Ophthalmology;  Laterality: Right;   GUM SURGERY      Family History  Problem Relation Age of Onset   Colon cancer Neg Hx    Esophageal cancer Neg Hx    Stomach cancer Neg Hx    Rectal cancer Neg Hx     Social:  reports that she has quit smoking. She has never used smokeless tobacco. She reports current alcohol use of about 7.0 standard drinks per week. She reports that she does not use drugs.  Allergies:  Allergies  Allergen Reactions   Codeine Nausea Only   Erythromycin Diarrhea    Medications: I have reviewed the patient's current medications.  Results for orders placed or performed during the hospital encounter of 11/30/20 (from the past 48 hour(s))  CBC with  Differential     Status: Abnormal   Collection Time: 11/30/20  4:06 PM  Result Value Ref Range   WBC 10.6 (H) 4.0 - 10.5 K/uL   RBC 3.69 (L) 3.87 - 5.11 MIL/uL   Hemoglobin 12.0 12.0 - 15.0 g/dL   HCT 34.9 (L) 36.0 - 46.0 %   MCV 94.6 80.0 - 100.0 fL   MCH 32.5 26.0 - 34.0 pg   MCHC 34.4 30.0 - 36.0 g/dL   RDW 11.9 11.5 - 15.5 %   Platelets 245 150 - 400 K/uL   nRBC 0.0 0.0 - 0.2 %   Neutrophils Relative % 76 %   Neutro Abs 8.0 (H) 1.7 - 7.7 K/uL   Lymphocytes Relative 12 %   Lymphs Abs 1.3 0.7 - 4.0 K/uL   Monocytes Relative 11 %   Monocytes Absolute 1.2 (H) 0.1 - 1.0 K/uL   Eosinophils Relative 1 %   Eosinophils Absolute 0.1 0.0 - 0.5 K/uL   Basophils Relative 0 %   Basophils Absolute 0.0 0.0 - 0.1 K/uL   Immature Granulocytes 0 %   Abs Immature Granulocytes 0.04 0.00 - 0.07 K/uL    Comment: Performed at [facility], [place] [address], [place], [place] [address]  Comprehensive metabolic panel     Status: Abnormal   Collection Time: 11/30/20  4:06 PM  Result Value Ref Range   Sodium 133 (L) 135 - 145 mmol/L   Potassium 4.2 3.5 - 5.1 mmol/L   Chloride 96 (L) 98 - 111 mmol/L   CO2 28 22 - 32 mmol/L   Glucose, Bld 103 (H) 70 - 99 mg/dL    Comment: Glucose reference range applies only to samples taken after fasting for at least 8 hours.   BUN 7 (L) 8 - 23 mg/dL   Creatinine, Ser 0.51 0.44 - 1.00 mg/dL   Calcium 9.4 8.9 - 10.3 mg/dL   Total Protein 7.4 6.5 - 8.1 g/dL   Albumin 3.6 3.5 - 5.0 g/dL   AST 16 15 - 41 U/L   ALT 20 0 - 44 U/L   Alkaline Phosphatase 59 38 - 126 U/L   Total Bilirubin 0.6 0.3 - 1.2 mg/dL   GFR, Estimated >60 >60 mL/min    Comment: (NOTE) Calculated using the CKD-EPI Creatinine Equation (2021)    Anion gap 9 5 - 15    Comment: Performed at [facility], [place] [address], [place], [place] [address]  Lipase, blood     Status: None   Collection Time: 11/30/20  4:06 PM  Result Value Ref Range   Lipase 22 11 - 51 U/L     Comment: Performed at [facility], [place] [address] [place],  [address]  Urinalysis, Routine w reflex microscopic     Status: Abnormal   Collection Time: 11/30/20  6:32 PM  Result Value Ref Range   Color, Urine YELLOW YELLOW   APPearance CLEAR CLEAR   Specific Gravity, Urine <1.005 (L) 1.005 - 1.030   pH 7.0 5.0 - 8.0   Glucose, UA NEGATIVE NEGATIVE mg/dL   Hgb urine dipstick TRACE (A) NEGATIVE   Bilirubin Urine NEGATIVE NEGATIVE   Ketones, ur TRACE (A) NEGATIVE mg/dL   Protein, ur NEGATIVE NEGATIVE mg/dL   Nitrite NEGATIVE NEGATIVE   Leukocytes,Ua NEGATIVE NEGATIVE   RBC / HPF 0-5 0 - 5 RBC/hpf   Bacteria, UA NONE SEEN NONE SEEN   Squamous Epithelial / LPF 0-5 0 - 5   Mucus PRESENT     Comment: Performed at [facility], [place] [address] [place],  [address]    CT ABDOMEN PELVIS W CONTRAST  Result Date: 11/30/2020 CLINICAL DATA:  Diarrhea for 2 weeks with gas and bloating. No history of cancer. EXAM: CT ABDOMEN AND PELVIS WITH CONTRAST TECHNIQUE: Multidetector CT imaging of the abdomen and pelvis was performed using the standard protocol following bolus administration of intravenous contrast. CONTRAST:  132mL ISOVUE-300 IOPAMIDOL (ISOVUE-300) INJECTION 61% COMPARISON:  None. FINDINGS: Lower chest: Anterior bibasilar scarring. Mild centrilobular emphysema. Normal heart size without pericardial or pleural effusion. Hepatobiliary: Normal liver. Normal gallbladder, without biliary ductal dilatation. Pancreas: Normal, without mass or ductal dilatation. Spleen: Normal in size, without focal abnormality. Adrenals/Urinary Tract: Normal adrenal glands. Normal kidneys, without hydronephrosis. Decompressed urinary bladder. Stomach/Bowel: Normal stomach, without wall thickening. Normal colon. Terminal ileum is normal on 60/2. The appendix originates on 62/2 and coronal image 46. Measures on the order of 1.0 cm proximally including on 61/2. Extends  cephalad to where its tip surrounded by an amorphous area of inflammation and soft tissue fullness, including at 4.8 x 4.6 cm on 57/2 and coronal image 54. This inflammatory mass is adjacent to the right ovary, which is identified on 59/2 and just lateral and superior to the distal ileum, which is mildly edematous on 62/2. Otherwise normal small  bowel. Vascular/Lymphatic: Aortic atherosclerosis. Duplicated IVC. No abdominopelvic adenopathy. Reproductive: Normal uterus and left adnexa. Other: Trace cul-de-sac fluid on 60/2. No free intraperitoneal air. Mild pelvic floor laxity. Musculoskeletal: No acute osseous abnormality. Transitional L5 vertebral body. IMPRESSION: 1. Heterogeneous right lower quadrant inflammatory soft tissue mass, surrounding the appendiceal tip in the setting of proximal appendiceal enlargement. Favor chronic appendicitis with microperforation and surrounding phlegmon. This inflammatory mass is immediately adjacent to a mildly thickened distal ileum, likely secondarily. The subjacent right ovary is felt unlikely to represent the epicenter. 2. Trace cul-de-sac fluid, likely secondary. 3. Aortic atherosclerosis (ICD10-I70.0) and emphysema (ICD10-J43.9). These results will be called to the ordering clinician or representative by the Radiologist Assistant, and communication documented in the PACS or [employer]. Electronically Signed   By: Abigail Miyamoto M.D.   On: 11/30/2020 14:00    ROS - all of the below systems have been reviewed with the patient and positives are indicated with bold text General: chills, fever or night sweats Eyes: blurry vision or double vision ENT: epistaxis or sore throat Allergy/Immunology: itchy/watery eyes or nasal congestion Hematologic/Lymphatic: bleeding problems, blood clots or swollen lymph nodes Endocrine: temperature intolerance or unexpected weight changes Breast: new or changing breast lumps or nipple discharge Resp: cough, shortness of breath, or  wheezing CV: chest pain or dyspnea on exertion GI: as per HPI GU: dysuria, trouble voiding, or hematuria MSK: joint pain or joint stiffness Neuro: TIA or stroke symptoms Derm: pruritus and skin lesion changes Psych: anxiety and depression  PE Blood pressure 139/71, pulse 72, temperature 99.2 F (37.3 C), temperature source Oral, resp. rate 16, height 5\' 3"  (1.6 m), weight 69.4 kg, SpO2 97 %. Constitutional: NAD; conversant; no deformities Eyes: Moist conjunctiva; no lid lag; anicteric; PERRL Neck: Trachea midline; no thyromegaly Lungs: Normal respiratory effort; no tactile fremitus CV: RRR; no palpable thrills; no pitting edema GI: Abd there is some fullness in the lower abdomen with some mild guarding at the lower midline but no peritonitis.  There are no hernias. ; no palpable hepatosplenomegaly MSK: No edema normal range of motion of extremities; no clubbing/cyanosis Psychiatric: Appropriate affect; alert and oriented x3   Results for orders placed or performed during the hospital encounter of 11/30/20 (from the past 48 hour(s))  CBC with Differential     Status: Abnormal   Collection Time: 11/30/20  4:06 PM  Result Value Ref Range   WBC 10.6 (H) 4.0 - 10.5 K/uL   RBC 3.69 (L) 3.87 - 5.11 MIL/uL   Hemoglobin 12.0 12.0 - 15.0 g/dL   HCT 34.9 (L) 36.0 - 46.0 %   MCV 94.6 80.0 - 100.0 fL   MCH 32.5 26.0 - 34.0 pg   MCHC 34.4 30.0 - 36.0 g/dL   RDW 11.9 11.5 - 15.5 %   Platelets 245 150 - 400 K/uL   nRBC 0.0 0.0 - 0.2 %   Neutrophils Relative % 76 %   Neutro Abs 8.0 (H) 1.7 - 7.7 K/uL   Lymphocytes Relative 12 %   Lymphs Abs 1.3 0.7 - 4.0 K/uL   Monocytes Relative 11 %   Monocytes Absolute 1.2 (H) 0.1 - 1.0 K/uL   Eosinophils Relative 1 %   Eosinophils Absolute 0.1 0.0 - 0.5 K/uL   Basophils Relative 0 %   Basophils Absolute 0.0 0.0 - 0.1 K/uL   Immature Granulocytes 0 %   Abs Immature Granulocytes 0.04 0.00 - 0.07 K/uL    Comment: Performed at [facility],  [place] [address] [place], [place] [address]  Comprehensive metabolic panel     Status: Abnormal   Collection Time: 11/30/20  4:06 PM  Result Value Ref Range   Sodium 133 (L) 135 - 145 mmol/L   Potassium 4.2 3.5 - 5.1 mmol/L   Chloride 96 (L) 98 - 111 mmol/L   CO2 28 22 - 32 mmol/L   Glucose, Bld 103 (H) 70 - 99 mg/dL    Comment: Glucose reference range applies only to samples taken after fasting for at least 8 hours.   BUN 7 (L) 8 - 23 mg/dL   Creatinine, Ser 0.51 0.44 - 1.00 mg/dL   Calcium 9.4 8.9 - 10.3 mg/dL   Total Protein 7.4 6.5 - 8.1 g/dL   Albumin 3.6 3.5 - 5.0 g/dL   AST 16 15 - 41 U/L   ALT 20 0 - 44 U/L   Alkaline Phosphatase 59 38 - 126 U/L   Total Bilirubin 0.6 0.3 - 1.2 mg/dL   GFR, Estimated >60 >60 mL/min    Comment: (NOTE) Calculated using the CKD-EPI Creatinine Equation (2021)    Anion gap 9 5 - 15    Comment: Performed at [facility], [place] [address] [place], [place] [address]  Lipase, blood     Status: None   Collection Time: 11/30/20  4:06 PM  Result Value Ref Range   Lipase 22 11 - 51 U/L    Comment: Performed at [facility], [place] [address], [place], [place] [address]  Urinalysis, Routine w reflex microscopic     Status: Abnormal   Collection Time: 11/30/20  6:32 PM  Result Value Ref Range   Color, Urine YELLOW YELLOW   APPearance CLEAR CLEAR   Specific Gravity, Urine <1.005 (L) 1.005 - 1.030   pH 7.0 5.0 - 8.0   Glucose, UA NEGATIVE NEGATIVE mg/dL   Hgb urine dipstick TRACE (A) NEGATIVE   Bilirubin Urine NEGATIVE NEGATIVE   Ketones, ur TRACE (A) NEGATIVE mg/dL   Protein, ur NEGATIVE NEGATIVE mg/dL   Nitrite NEGATIVE NEGATIVE   Leukocytes,Ua NEGATIVE NEGATIVE   RBC / HPF 0-5 0 - 5 RBC/hpf   Bacteria, UA NONE SEEN NONE SEEN   Squamous Epithelial / LPF 0-5 0 - 5   Mucus PRESENT     Comment: Performed at [facility], [place] [address] [place],  [address]    CT ABDOMEN  PELVIS W CONTRAST  Result Date: 11/30/2020 CLINICAL DATA:  Diarrhea for 2 weeks with gas and bloating. No history of cancer. EXAM: CT ABDOMEN AND PELVIS WITH CONTRAST TECHNIQUE: Multidetector CT imaging of the abdomen and pelvis was performed using the standard protocol following bolus administration of intravenous contrast. CONTRAST:  163mL ISOVUE-300 IOPAMIDOL (ISOVUE-300) INJECTION 61% COMPARISON:  None. FINDINGS: Lower chest: Anterior bibasilar scarring. Mild centrilobular emphysema. Normal heart size without pericardial or pleural effusion. Hepatobiliary: Normal liver. Normal gallbladder, without biliary ductal dilatation. Pancreas: Normal, without mass or ductal dilatation. Spleen: Normal in size, without focal abnormality. Adrenals/Urinary Tract: Normal adrenal glands. Normal kidneys, without hydronephrosis. Decompressed urinary bladder. Stomach/Bowel: Normal stomach, without wall thickening. Normal colon. Terminal ileum is normal on 60/2. The appendix originates on 62/2 and coronal image 46. Measures on the order of 1.0 cm proximally including on 61/2. Extends cephalad to where its tip surrounded by an amorphous area of inflammation and soft tissue fullness, including at 4.8 x 4.6 cm on 57/2 and coronal image 54. This inflammatory mass is adjacent to the  right ovary, which is identified on 59/2 and just lateral and superior to the distal ileum, which is mildly edematous on 62/2. Otherwise normal small bowel. Vascular/Lymphatic: Aortic atherosclerosis. Duplicated IVC. No abdominopelvic adenopathy. Reproductive: Normal uterus and left adnexa. Other: Trace cul-de-sac fluid on 60/2. No free intraperitoneal air. Mild pelvic floor laxity. Musculoskeletal: No acute osseous abnormality. Transitional L5 vertebral body. IMPRESSION: 1. Heterogeneous right lower quadrant inflammatory soft tissue mass, surrounding the appendiceal tip in the setting of proximal appendiceal enlargement. Favor chronic appendicitis with  microperforation and surrounding phlegmon. This inflammatory mass is immediately adjacent to a mildly thickened distal ileum, likely secondarily. The subjacent right ovary is felt unlikely to represent the epicenter. 2. Trace cul-de-sac fluid, likely secondary. 3. Aortic atherosclerosis (ICD10-I70.0) and emphysema (ICD10-J43.9). These results will be called to the ordering clinician or representative by the Radiologist Assistant, and communication documented in the PACS or [employer]. Electronically Signed   By: Abigail Miyamoto M.D.   On: 11/30/2020 14:00     A/P: Caelin Rosen is an 65 y.o. female with perforated appendicitis  I have reviewed her CAT scan of the abdomen and pelvis.  She has a phlegmon in the right lower quadrant with an inflammatory mass.  There is not appear to be an abscess that can be drained.  There is some mild thickening distal small bowel.  There is some trace fluid in the pelvis and the ovary is adjacent to this but per the radiologist does not seem particularly abnormal.  Again, a contained perforated appendicitis is favored.  There is no adenopathy.  There are no other abnormalities on the CT scan. Given her symptoms, I agree with the radiology findings.  I discussed the diagnosis with the patient and her husband.  I would recommend admission for IV antibiotics.  She does not have an acute abdomen and her vitals are normal.  Given the phlegmon, I would not recommend an attempt at a laparoscopic appendectomy at this point as she would be at risk of injury to surrounding structures, the need to convert to an open procedure, and the risk for the need of a bowel resection.  With antibiotics, hopefully this will improve and a delayed appendectomy can be performed in approximately 6 to 8 weeks.  I will order tumor markers.  I discussed this with my partner who is our acute care surgeon this week as to whether or not any further work-up like a pelvic ultrasound will be  necessary at this point.  She and her husband again agree with the plans.  Coralie Keens, MD [facility]

## 2020-12-01 ENCOUNTER — Encounter (HOSPITAL_COMMUNITY): Payer: Self-pay | Admitting: Surgery

## 2020-12-01 LAB — CBC
HCT: 34.9 % — ABNORMAL LOW (ref 36.0–46.0)
Hemoglobin: 11.8 g/dL — ABNORMAL LOW (ref 12.0–15.0)
MCH: 32.1 pg (ref 26.0–34.0)
MCHC: 33.8 g/dL (ref 30.0–36.0)
MCV: 94.8 fL (ref 80.0–100.0)
Platelets: 227 10*3/uL (ref 150–400)
RBC: 3.68 MIL/uL — ABNORMAL LOW (ref 3.87–5.11)
RDW: 12.1 % (ref 11.5–15.5)
WBC: 9.9 10*3/uL (ref 4.0–10.5)
nRBC: 0 % (ref 0.0–0.2)

## 2020-12-01 LAB — HIV ANTIBODY (ROUTINE TESTING W REFLEX): HIV Screen 4th Generation wRfx: NONREACTIVE

## 2020-12-01 NOTE — Plan of Care (Signed)

## 2020-12-01 NOTE — Progress Notes (Signed)
The patient is injury-free, afebrile, alert, and oriented X 4.  Vital signs were within the baseline during this shift. She denies abdominal pain or diarrhea. She denies chest pain, SOB, nausea, vomiting, dizziness, signs or symptoms of bleeding, or acute changes during this shift. We will continue to monitor and work toward achieving the care plan goals

## 2020-12-01 NOTE — Progress Notes (Signed)
Subjective: Patient feels well except back and neck pain which is chronic.  She denies really any abdominal pain.  No diarrhea since admission.  No nausea or vomiting.  Tolerating liquids and hungry and wanting solid food.  ROS: See above, otherwise other systems negative  Objective: Vital signs in last 24 hours: Temp:  [98.2 F (36.8 C)-99.2 F (37.3 C)] 98.2 F (36.8 C) (09/28 0801) Pulse Rate:  [63-72] 63 (09/28 0801) Resp:  [16-20] 20 (09/28 0801) BP: (121-157)/(67-74) 121/68 (09/28 0801) SpO2:  [97 %-100 %] 100 % (09/28 0801) Weight:  [69.4 kg] 69.4 kg (09/27 1532) Last BM Date: 11/30/20  Intake/Output from previous day: No intake/output data recorded. Intake/Output this shift: Total I/O In: 355 [P.O.:355] Out: -   PE: Heart: regular Lungs: CTAB Abd: soft, really nontender diffusely, +BS, ND  Lab Results:  Recent Labs    11/30/20 1606 12/01/20 0511  WBC 10.6* 9.9  HGB 12.0 11.8*  HCT 34.9* 34.9*  PLT 245 227   BMET Recent Labs    11/30/20 1606  NA 133*  K 4.2  CL 96*  CO2 28  GLUCOSE 103*  BUN 7*  CREATININE 0.51  CALCIUM 9.4   PT/INR No results for input(s): LABPROT, INR in the last 72 hours. CMP     Component Value Date/Time   NA 133 (L) 11/30/2020 1606   K 4.2 11/30/2020 1606   CL 96 (L) 11/30/2020 1606   CO2 28 11/30/2020 1606   GLUCOSE 103 (H) 11/30/2020 1606   BUN 7 (L) 11/30/2020 1606   CREATININE 0.51 11/30/2020 1606   CALCIUM 9.4 11/30/2020 1606   PROT 7.4 11/30/2020 1606   ALBUMIN 3.6 11/30/2020 1606   AST 16 11/30/2020 1606   ALT 20 11/30/2020 1606   ALKPHOS 59 11/30/2020 1606   BILITOT 0.6 11/30/2020 1606   GFRNONAA >60 11/30/2020 1606   GFRAA >90 12/26/2011 1015   Lipase     Component Value Date/Time   LIPASE 22 11/30/2020 1606       Studies/Results: CT ABDOMEN PELVIS W CONTRAST  Result Date: 11/30/2020 CLINICAL DATA:  Diarrhea for 2 weeks with gas and bloating. No history of cancer. EXAM: CT ABDOMEN  AND PELVIS WITH CONTRAST TECHNIQUE: Multidetector CT imaging of the abdomen and pelvis was performed using the standard protocol following bolus administration of intravenous contrast. CONTRAST:  166mL ISOVUE-300 IOPAMIDOL (ISOVUE-300) INJECTION 61% COMPARISON:  None. FINDINGS: Lower chest: Anterior bibasilar scarring. Mild centrilobular emphysema. Normal heart size without pericardial or pleural effusion. Hepatobiliary: Normal liver. Normal gallbladder, without biliary ductal dilatation. Pancreas: Normal, without mass or ductal dilatation. Spleen: Normal in size, without focal abnormality. Adrenals/Urinary Tract: Normal adrenal glands. Normal kidneys, without hydronephrosis. Decompressed urinary bladder. Stomach/Bowel: Normal stomach, without wall thickening. Normal colon. Terminal ileum is normal on 60/2. The appendix originates on 62/2 and coronal image 46. Measures on the order of 1.0 cm proximally including on 61/2. Extends cephalad to where its tip surrounded by an amorphous area of inflammation and soft tissue fullness, including at 4.8 x 4.6 cm on 57/2 and coronal image 54. This inflammatory mass is adjacent to the right ovary, which is identified on 59/2 and just lateral and superior to the distal ileum, which is mildly edematous on 62/2. Otherwise normal small bowel. Vascular/Lymphatic: Aortic atherosclerosis. Duplicated IVC. No abdominopelvic adenopathy. Reproductive: Normal uterus and left adnexa. Other: Trace cul-de-sac fluid on 60/2. No free intraperitoneal air. Mild pelvic floor laxity. Musculoskeletal: No acute osseous abnormality. Transitional  L5 vertebral body. IMPRESSION: 1. Heterogeneous right lower quadrant inflammatory soft tissue mass, surrounding the appendiceal tip in the setting of proximal appendiceal enlargement. Favor chronic appendicitis with microperforation and surrounding phlegmon. This inflammatory mass is immediately adjacent to a mildly thickened distal ileum, likely secondarily.  The subjacent right ovary is felt unlikely to represent the epicenter. 2. Trace cul-de-sac fluid, likely secondary. 3. Aortic atherosclerosis (ICD10-I70.0) and emphysema (ICD10-J43.9). These results will be called to the ordering clinician or representative by the Radiologist Assistant, and communication documented in the PACS or [employer]. Electronically Signed   By: Abigail Miyamoto M.D.   On: 11/30/2020 14:00    Anti-infectives: Anti-infectives (From admission, onward)    Start     Dose/Rate Route Frequency Ordered Stop   12/01/20 0600  piperacillin-tazobactam (ZOSYN) IVPB 3.375 g        3.375 g 12.5 mL/hr over 240 Minutes Intravenous Every 8 hours 11/30/20 2150 12/08/20 0559   11/30/20 2145  piperacillin-tazobactam (ZOSYN) IVPB 3.375 g  Status:  Discontinued        3.375 g 12.5 mL/hr over 240 Minutes Intravenous Every 8 hours 11/30/20 2143 11/30/20 2148   11/30/20 1830  cefTRIAXone (ROCEPHIN) 2 g in sodium chloride 0.9 % 100 mL IVPB       See Hyperspace for full Linked Orders Report.   2 g 200 mL/hr over 30 Minutes Intravenous  Once 11/30/20 1817 11/30/20 1943   11/30/20 1830  metroNIDAZOLE (FLAGYL) IVPB 500 mg       See Hyperspace for full Linked Orders Report.   500 mg 100 mL/hr over 60 Minutes Intravenous  Once 11/30/20 1817 11/30/20 1944        Assessment/Plan Perforated appendicitis with phlegmon and inflammatory mass -The patient looks remarkably well.  WBC normal and AF -will give regular diet at this point -cont IV abx for treatment for now given this inflammatory mass -onc markers still pending, but less likely to be etiology  FEN - regular, SLIV VTE - Lovenox ID - [email or web address]  Chronic neck and back pain HTN HLD  -resume home meds-   LOS: 1 day    Henreitta Cea , [facility] Surgery 12/01/2020, 11:03 AM Please see Amion for pager number during day hours 7:00am-4:30pm or 7:00am -11:30am on weekends

## 2020-12-01 NOTE — Progress Notes (Signed)
Attempted to contact her daughter per her request.  I got her voicemail and left her a brief voicemail.  Henreitta Cea 11:47 AM

## 2020-12-02 LAB — CA 125: Cancer Antigen (CA) 125: 9.1 U/mL (ref 0.0–38.1)

## 2020-12-02 LAB — CEA: CEA: 0.8 ng/mL (ref 0.0–4.7)

## 2020-12-02 MED ORDER — AMOXICILLIN-POT CLAVULANATE 875-125 MG PO TABS: 1.0000 | ORAL_TABLET | Freq: Two times a day (BID) | ORAL | 0 refills | Status: AC

## 2020-12-02 NOTE — Discharge Summary (Signed)
Patient ID: Pamela Ross [id number] [birth year] 65 y.o.  Admit date: 11/30/2020 Discharge date: 12/02/2020  Admitting Diagnosis: Contained perforated appendicitis  Discharge Diagnosis Patient Active Problem List   Diagnosis Date Noted   Perforated appendicitis 11/30/2020   Macular hole 12/15/2011    Consultants none  Reason for Admission: This is a pleasant 65 year old female who started having some abdominal cramping with bloating and diarrhea approximately 2 weeks ago.  Most of the loose bowel moods were in the morning.  This slowly improved and she had improvement of diarrhea with Imodium.  She had no nausea or vomiting.  She had no appetite at first this is now returned.  She saw her primary care provider last Friday and a CAT scan was recommended.  Over the weekend she started having low-grade temperatures with the highest being 100.  She underwent a CT scan today which showed findings worrisome for possible perforated appendicitis so she was sent to the emergency department. Currently, she reports no abdominal pain.  She is hungry.  She still has had some bloating. She has no similar history of this discomfort.  Her last colonoscopy in 2013 showed benign polyps.  She has no dysuria.  Procedures none  Hospital Course:  The patient was admitted for IV abx therapy.  Her WBC remained normal and she remained AF.  She had no abdominal pain and her diet was advanced as tolerated.  No plans for surgical intervention at this time due to significant inflammatory mass noted on CT scan and higher risk of complications.  Will likely plan for interval lap appy in 6-8 weeks.  Follow up is being arranged along with outpatient oral augmentin for 2 weeks total and a follow up CT scan prior to her follow up appointment.  Physical Exam: Heart: regular Lungs: CTAB Abd: soft, NT, Nd, +BS  Allergies as of 12/02/2020       Reactions   Codeine Nausea Only   Erythromycin Diarrhea         Medication List     TAKE these medications    ACAI BERRY PO Take 1 tablet by mouth daily.   amoxicillin-clavulanate 875-125 MG tablet Commonly known as: Augmentin Take 1 tablet by mouth 2 (two) times daily for 13 days.   bacitracin-polymyxin b ophthalmic ointment Commonly known as: POLYSPORIN Place 1 application into the right eye 4 (four) times daily. apply to eye every 12 hours while awake   calcium carbonate 600 MG Tabs tablet Commonly known as: OS-CAL Take 600 mg by mouth 2 (two) times daily with a meal.   carvedilol 6.25 MG tablet Commonly known as: COREG Take 6.25 mg by mouth 2 (two) times daily with a meal.   citalopram 20 MG tablet Commonly known as: CELEXA Take 1 tablet by mouth Daily.   clonazePAM 0.5 MG tablet Commonly known as: KLONOPIN Take 0.5 mg by mouth at bedtime.   irbesartan 300 MG tablet Commonly known as: AVAPRO Take 300 mg by mouth daily.   losartan 100 MG tablet Commonly known as: COZAAR Take 100 mg by mouth daily. Takes at 1400   magnesium gluconate 500 MG tablet Commonly known as: MAGONATE Take 500 mg by mouth at bedtime.   Melatonin 5 MG Caps Take 5 mg by mouth at bedtime.   meloxicam 15 MG tablet Commonly known as: MOBIC Take 1 tablet (15 mg total) by mouth daily.   MULTIVITAMIN PO Take by mouth daily.   Shingrix injection Generic drug: Zoster Vaccine Adjuvanted  Shingrix (PF) 50 mcg/0.5 mL intramuscular suspension, kit   Valerian 500 MG Caps Take 1 capsule by mouth at bedtime.          Follow-up Information     Coralie Keens, MD Follow up in 2 week(s).   Specialty: General Surgery Why: Our office will call you with appointment date and time for follow up as well as CT scan. if you have not heard anything by early next week, please call and confirm. Contact information: [address] [place] [address] [phone]                 Signed: Saverio Danker, [facility]  Surgery 12/02/2020, 10:01 AM Please see Amion for pager number during day hours 7:00am-4:30pm, 7-11:30am on Weekends

## 2020-12-06 ENCOUNTER — Other Ambulatory Visit: Payer: Self-pay | Admitting: Surgery

## 2020-12-06 DIAGNOSIS — K3532 Acute appendicitis with perforation and localized peritonitis, without abscess: Secondary | ICD-10-CM

## 2020-12-09 ENCOUNTER — Other Ambulatory Visit (HOSPITAL_BASED_OUTPATIENT_CLINIC_OR_DEPARTMENT_OTHER): Payer: Self-pay | Admitting: Family Medicine

## 2020-12-09 ENCOUNTER — Other Ambulatory Visit (HOSPITAL_COMMUNITY): Payer: Self-pay | Admitting: Surgery

## 2020-12-09 DIAGNOSIS — Z1231 Encounter for screening mammogram for malignant neoplasm of breast: Secondary | ICD-10-CM

## 2020-12-09 DIAGNOSIS — K3532 Acute appendicitis with perforation and localized peritonitis, without abscess: Secondary | ICD-10-CM

## 2020-12-15 ENCOUNTER — Other Ambulatory Visit: Payer: Self-pay

## 2020-12-15 ENCOUNTER — Ambulatory Visit (HOSPITAL_BASED_OUTPATIENT_CLINIC_OR_DEPARTMENT_OTHER)
Admission: RE | Admit: 2020-12-15 | Discharge: 2020-12-15 | Disposition: A | Discharge status: Home / Self Care | Payer: Medicare Other | Source: Ambulatory Visit | Attending: Family Medicine | Admitting: Family Medicine

## 2020-12-15 ENCOUNTER — Encounter (HOSPITAL_BASED_OUTPATIENT_CLINIC_OR_DEPARTMENT_OTHER): Payer: Self-pay

## 2020-12-15 ENCOUNTER — Ambulatory Visit (HOSPITAL_BASED_OUTPATIENT_CLINIC_OR_DEPARTMENT_OTHER)
Admission: RE | Admit: 2020-12-15 | Discharge: 2020-12-15 | Disposition: A | Discharge status: Home / Self Care | Payer: Medicare Other | Source: Ambulatory Visit | Attending: Surgery | Admitting: Surgery

## 2020-12-15 DIAGNOSIS — K3532 Acute appendicitis with perforation and localized peritonitis, without abscess: Secondary | ICD-10-CM | POA: Insufficient documentation

## 2020-12-15 DIAGNOSIS — Z1231 Encounter for screening mammogram for malignant neoplasm of breast: Secondary | ICD-10-CM | POA: Diagnosis not present

## 2020-12-15 DIAGNOSIS — K5900 Constipation, unspecified: Secondary | ICD-10-CM | POA: Diagnosis not present

## 2020-12-15 DIAGNOSIS — K3580 Unspecified acute appendicitis: Secondary | ICD-10-CM | POA: Diagnosis not present

## 2020-12-15 MED ORDER — IOHEXOL 300 MG/ML  SOLN
100.0000 mL | Freq: Once | INTRAMUSCULAR | Status: AC | PRN
  Administered 2020-12-15: 100 mL via INTRAVENOUS

## 2020-12-17 ENCOUNTER — Other Ambulatory Visit: Payer: Self-pay | Admitting: Surgery

## 2020-12-17 DIAGNOSIS — K3532 Acute appendicitis with perforation and localized peritonitis, without abscess: Secondary | ICD-10-CM | POA: Diagnosis not present

## 2020-12-21 DIAGNOSIS — Z23 Encounter for immunization: Secondary | ICD-10-CM | POA: Diagnosis not present

## 2020-12-22 ENCOUNTER — Other Ambulatory Visit: Payer: Medicare Other

## 2020-12-28 NOTE — Patient Instructions (Addendum)
DUE TO COVID-19 ONLY ONE VISITOR IS ALLOWED TO COME WITH YOU AND STAY IN THE WAITING ROOM ONLY DURING PRE OP AND PROCEDURE.   **NO VISITORS ARE ALLOWED IN THE SHORT STAY AREA OR RECOVERY ROOM!!**        Your procedure is scheduled on:  Friday, 01-07-21   Report to [facility] Main  Entrance     Report to admitting at 6:45 AM   Call this number if you have problems the morning of surgery [phone]   Do not eat food :After Midnight.   May have liquids until 6:00 AM day of surgery  CLEAR LIQUID DIET  Foods Allowed                                                                     Foods Excluded  Water, Black Coffee (no milk/no creamer) and tea, regular and decaf                              liquids that you cannot  Plain Jell-O in any flavor  (No red)                         see through such as: Fruit ices (not with fruit pulp)                                 milk, soups, orange juice  Iced Popsicles (No red)                                    All solid food                             Apple juices Sports drinks like Gatorade (No red) Lightly seasoned clear broth or consume(fat free) Sugar       Oral Hygiene is also important to reduce your risk of infection.                                    Remember - BRUSH YOUR TEETH THE MORNING OF SURGERY WITH YOUR REGULAR TOOTHPASTE   Do NOT smoke after Midnight   Take these medicines the morning of surgery with A SIP OF WATER:  Carvedilol, Citalopram, Clonazepam   Stop all vitamins and herbal supplements a week before surgery             You may not have any metal on your body including hair pins, jewelry, and body piercing             Do not wear make-up, lotions, powders, perfumes or deodorant  Do not wear nail polish including gel and S&S, artificial/acrylic nails, or any other type of covering on natural nails including finger and toenails. If you have artificial nails, gel coating, etc. that needs to be removed by a  nail salon please have this removed prior to surgery or surgery may need to  be canceled/ delayed if the surgeon/ anesthesia feels like they are unable to be safely monitored.   Do not shave  48 hours prior to surgery.         Do not bring valuables to the hospital. [place].   Contacts, dentures or bridgework may not be worn into surgery.   Patients discharged the day of surgery will not be allowed to drive home.  Please read over the following fact sheets you were given: IF YOU HAVE QUESTIONS ABOUT YOUR PRE OP INSTRUCTIONS PLEASE CALL [facility] - Preparing for Surgery Before surgery, you can play an important role.  Because skin is not sterile, your skin needs to be as free of germs as possible.  You can reduce the number of germs on your skin by washing with CHG (chlorahexidine gluconate) soap before surgery.  CHG is an antiseptic cleaner which kills germs and bonds with the skin to continue killing germs even after washing. Please DO NOT use if you have an allergy to CHG or antibacterial soaps.  If your skin becomes reddened/irritated stop using the CHG and inform your nurse when you arrive at Short Stay. Do not shave (including legs and underarms) for at least 48 hours prior to the first CHG shower.  You may shave your face/neck.  Please follow these instructions carefully:  1.  Shower with CHG Soap the night before surgery and the  morning of surgery.  2.  If you choose to wash your hair, wash your hair first as usual with your normal  shampoo.  3.  After you shampoo, rinse your hair and body thoroughly to remove the shampoo.                             4.  Use CHG as you would any other liquid soap.  You can apply chg directly to the skin and wash.  Gently with a scrungie or clean washcloth.  5.  Apply the CHG Soap to your body ONLY FROM THE NECK DOWN.   Do   not use on face/ open                           Wound or open sores.  Avoid contact with eyes, ears mouth and   genitals (private parts).                       Wash face,  Genitals (private parts) with your normal soap.             6.  Wash thoroughly, paying special attention to the area where your    surgery  will be performed.  7.  Thoroughly rinse your body with warm water from the neck down.  8.  DO NOT shower/wash with your normal soap after using and rinsing off the CHG Soap.                9.  Pat yourself dry with a clean towel.            10.  Wear clean pajamas.            11.  Place clean sheets on your bed the night of your first shower and do not  sleep with pets. Day of Surgery : Do not apply any lotions/deodorants the morning of surgery.  Please wear clean clothes to the hospital/surgery center.  FAILURE TO FOLLOW THESE INSTRUCTIONS MAY RESULT IN THE CANCELLATION OF YOUR SURGERY  PATIENT SIGNATURE_________________________________  NURSE SIGNATURE__________________________________  ________________________________________________________________________

## 2020-12-29 ENCOUNTER — Encounter (HOSPITAL_COMMUNITY): Payer: Self-pay

## 2020-12-29 ENCOUNTER — Encounter (HOSPITAL_COMMUNITY)
Admission: RE | Admit: 2020-12-29 | Discharge: 2020-12-29 | Disposition: A | Discharge status: Home / Self Care | Payer: Medicare Other | Source: Ambulatory Visit | Attending: Surgery | Admitting: Surgery

## 2020-12-29 ENCOUNTER — Other Ambulatory Visit: Payer: Self-pay

## 2020-12-29 VITALS — BP 129/70 | HR 55 | Temp 98.4°F | Resp 18 | Ht 62.0 in | Wt 147.2 lb

## 2020-12-29 DIAGNOSIS — I10 Essential (primary) hypertension: Secondary | ICD-10-CM

## 2020-12-29 DIAGNOSIS — Z01818 Encounter for other preprocedural examination: Secondary | ICD-10-CM | POA: Diagnosis not present

## 2020-12-29 HISTORY — DX: Gastro-esophageal reflux disease without esophagitis: K21.9

## 2020-12-29 LAB — BASIC METABOLIC PANEL
Anion gap: 4 — ABNORMAL LOW (ref 5–15)
BUN: 12 mg/dL (ref 8–23)
CO2: 29 mmol/L (ref 22–32)
Calcium: 9.3 mg/dL (ref 8.9–10.3)
Chloride: 97 mmol/L — ABNORMAL LOW (ref 98–111)
Creatinine, Ser: 0.6 mg/dL (ref 0.44–1.00)
GFR, Estimated: >60 mL/min (ref >60)
Glucose, Bld: 93 mg/dL (ref 70–99)
Potassium: 4.3 mmol/L (ref 3.5–5.1)
Sodium: 130 mmol/L — ABNORMAL LOW (ref 135–145)

## 2020-12-29 LAB — CBC
HCT: 40.3 % (ref 36.0–46.0)
Hemoglobin: 13.8 g/dL (ref 12.0–15.0)
MCH: 32.1 pg (ref 26.0–34.0)
MCHC: 34.2 g/dL (ref 30.0–36.0)
MCV: 93.7 fL (ref 80.0–100.0)
Platelets: 169 10*3/uL (ref 150–400)
RBC: 4.3 MIL/uL (ref 3.87–5.11)
RDW: 13.2 % (ref 11.5–15.5)
WBC: 4.7 10*3/uL (ref 4.0–10.5)
nRBC: 0 % (ref 0.0–0.2)

## 2020-12-29 NOTE — Progress Notes (Addendum)
COVID swab appointment:  N/A  COVID Vaccine Completed: Yes x5 Date COVID Vaccine completed: Has received booster: COVID vaccine manufacturer: [employer]     Date of COVID positive in last 90 days: No  PCP - C. Melinda Crutch, MD Cardiologist -   Chest x-ray - N/A EKG - 12-29-20 Epic Stress Test - N/A ECHO - N/A Cardiac Cath - N/A Pacemaker/ICD device last checked: Spinal Cord Stimulator:  Sleep Study - N/A CPAP -   Fasting Blood Sugar - N/A Checks Blood Sugar _____ times a day  Blood Thinner Instructions:  N/A Aspirin Instructions: Last Dose:  Activity level:  Can go up a flight of stairs and perform activities of daily living without stopping and without symptoms of chest pain or shortness of breath.      Anesthesia review: N/A  Patient denies shortness of breath, fever, cough and chest pain at PAT appointment   Patient verbalized understanding of instructions that were given to them at the PAT appointment. Patient was also instructed that they will need to review over the PAT instructions again at home before surgery.

## 2021-01-06 NOTE — H&P (Signed)
PROVIDER: Beverlee Nims, MD  MRN: [id number] DOB: [birth year] DATE OF ENCOUNTER: 12/17/2020 Subjective   Chief Complaint: New Consultation (Perforated appendicitis with phlegmon)   History of Present Illness: Pamela Ross is a 65 y.o. female who is seen today for a hospital follow-up regarding her admission on September 29 for perforated appendicitis. This was managed nonoperatively. She was discharged home on oral antibiotics. She reports now that she feels well and has had minimal abdominal discomfort. She is starting to move her bowels better. She denies fevers. She just finished her course of antibiotics..  Review of Systems: A complete review of systems was obtained from the patient. I have reviewed this information and discussed as appropriate with the patient. See HPI as well for other ROS.  ROS   Medical History: Past Medical History:  Diagnosis Date   Anxiety   Arthritis   Hyperlipidemia   Hypertension   Patient Active Problem List  Diagnosis   Macular hole   Perforated appendicitis   Past Surgical History:  Procedure Laterality Date   CATARACT EXTRACTION 2013   RETINA DETECTION OR MONITORING    Allergies  Allergen Reactions   Codeine Nausea   Erythromycin Diarrhea   Current Outpatient Medications on File Prior to Visit  Medication Sig Dispense Refill   ACAI BERRY EXTRACT ORAL Take 1 tablet by mouth once daily   calcium carbonate 600 mg calcium (1,500 mg) Tab tablet Take by mouth   carvediloL (COREG) 6.25 MG tablet 1 TABLET WITH FOOD BY MOUTH TWICE A DAY 90 DAYS   citalopram (CELEXA) 20 MG tablet   clonazePAM (KLONOPIN) 0.5 MG tablet 1 TABLET ORALLY ONCE AT BEDTIME AND AS NEEDED ( MUST LAST 30 DAYS) DX F41.1   irbesartan (AVAPRO) 300 MG tablet   losartan (COZAAR) 100 MG tablet Take by mouth   magnesium gluconate (MAGONATE) 27.5 mg magne- sium (500 mg) tablet Take by mouth   melatonin 5 mg Cap Take by mouth   meloxicam (MOBIC) 15 MG  tablet Take 15 mg by mouth once daily   valerian root 500 mg Cap Take 1 capsule by mouth at bedtime   No current facility-administered medications on file prior to visit.   History reviewed. No pertinent family history.   Social History   Tobacco Use  Smoking Status Former Smoker   Quit date: 2013   Years since quitting: 9.7  Smokeless Tobacco Current User    Social History   Socioeconomic History   Marital status: Married  Tobacco Use   Smoking status: Former Smoker  Quit date: 2013  Years since quitting: 9.7   Smokeless tobacco: Current Counsellor Use: Every day  Substance and Sexual Activity   Alcohol use: Yes   Drug use: Never   Objective:   Vitals:  12/17/20 0926  BP: 126/82  Pulse: 65  Temp: 36.8 C (98.2 F)  SpO2: 96%  Weight: 67.6 kg (149 lb)  Height: 157.5 cm (5\' 2" )   Body mass index is 27.25 kg/m.  Physical Exam   She appears well on exam  Her abdomen is soft and nontender. There are no masses.  Lungs clear   CV RRR  Neuro grossly intact   Labs, Imaging and Diagnostic Testing:  I reviewed her CT scan which shows marked improvement in the area of the phlegmon with a minimal 1.5 cm air and fluid collection  Assessment and Plan:  Diagnoses and all orders for this visit:  Perforated appendix  I had a Ross discussion with the patient and her husband. I would recommend an interval appendectomy in November given her age to rule out malignancy in the appendix as well as prevent further appendicitis. I discussed this with him in detail. This will be attempted laparoscopically. I discussed the risks which includes but is not limited to bleeding, infection, injury to surrounding structures, stump leak, the need to convert to an open procedure, postoperative recovery, etc. They understand and agree to proceed with surgery which will be scheduled

## 2021-01-07 ENCOUNTER — Ambulatory Visit (HOSPITAL_COMMUNITY): Payer: Medicare Other | Admitting: Certified Registered"

## 2021-01-07 ENCOUNTER — Ambulatory Visit (HOSPITAL_COMMUNITY)
Admission: RE | Admit: 2021-01-07 | Discharge: 2021-01-07 | Disposition: A | Discharge status: Home / Self Care | Payer: Medicare Other | Source: Ambulatory Visit | Attending: Surgery | Admitting: Surgery

## 2021-01-07 ENCOUNTER — Encounter (HOSPITAL_COMMUNITY): Payer: Self-pay | Admitting: Surgery

## 2021-01-07 ENCOUNTER — Encounter (HOSPITAL_COMMUNITY)
Admission: RE | Disposition: A | Discharge status: Home / Self Care | Payer: Self-pay | Source: Ambulatory Visit | Attending: Surgery

## 2021-01-07 DIAGNOSIS — K3532 Acute appendicitis with perforation and localized peritonitis, without abscess: Secondary | ICD-10-CM | POA: Diagnosis not present

## 2021-01-07 DIAGNOSIS — K353 Acute appendicitis with localized peritonitis, without perforation or gangrene: Secondary | ICD-10-CM | POA: Diagnosis not present

## 2021-01-07 DIAGNOSIS — Z87891 Personal history of nicotine dependence: Secondary | ICD-10-CM | POA: Diagnosis not present

## 2021-01-07 DIAGNOSIS — Z885 Allergy status to narcotic agent status: Secondary | ICD-10-CM | POA: Diagnosis not present

## 2021-01-07 DIAGNOSIS — Z79899 Other long term (current) drug therapy: Secondary | ICD-10-CM | POA: Insufficient documentation

## 2021-01-07 DIAGNOSIS — K3580 Unspecified acute appendicitis: Secondary | ICD-10-CM | POA: Diagnosis not present

## 2021-01-07 DIAGNOSIS — Z881 Allergy status to other antibiotic agents status: Secondary | ICD-10-CM | POA: Insufficient documentation

## 2021-01-07 DIAGNOSIS — E785 Hyperlipidemia, unspecified: Secondary | ICD-10-CM | POA: Diagnosis not present

## 2021-01-07 DIAGNOSIS — I10 Essential (primary) hypertension: Secondary | ICD-10-CM | POA: Diagnosis not present

## 2021-01-07 DIAGNOSIS — K219 Gastro-esophageal reflux disease without esophagitis: Secondary | ICD-10-CM | POA: Diagnosis not present

## 2021-01-07 HISTORY — PX: LAPAROSCOPIC APPENDECTOMY: SHX408

## 2021-01-07 SURGERY — APPENDECTOMY, LAPAROSCOPIC
Anesthesia: General | Site: Abdomen

## 2021-01-07 MED ORDER — ONDANSETRON HCL 4 MG/2ML IJ SOLN: 4.0000 mg | Freq: Once | INTRAMUSCULAR | Status: DC | PRN

## 2021-01-07 MED ORDER — FENTANYL CITRATE (PF) 100 MCG/2ML IJ SOLN
INTRAMUSCULAR | Status: AC
  Filled 2021-01-07: qty 2

## 2021-01-07 MED ORDER — ACETAMINOPHEN 160 MG/5ML PO SOLN: 325.0000 mg | ORAL | Status: DC | PRN

## 2021-01-07 MED ORDER — TRAMADOL HCL 50 MG PO TABS: 50.0000 mg | ORAL_TABLET | Freq: Four times a day (QID) | ORAL | 0 refills | Status: AC | PRN

## 2021-01-07 MED ORDER — LIDOCAINE 2% (20 MG/ML) 5 ML SYRINGE
INTRAMUSCULAR | Status: DC | PRN
  Administered 2021-01-07: 40 mg via INTRAVENOUS

## 2021-01-07 MED ORDER — LACTATED RINGERS IV SOLN
INTRAVENOUS | Status: DC | PRN
  Administered 2021-01-07: 1000 mL

## 2021-01-07 MED ORDER — ACETAMINOPHEN 325 MG PO TABS
325.0000 mg | ORAL_TABLET | ORAL | Status: DC | PRN
  Administered 2021-01-07: 650 mg via ORAL

## 2021-01-07 MED ORDER — BUPIVACAINE HCL (PF) 0.5 % IJ SOLN
INTRAMUSCULAR | Status: DC | PRN
  Administered 2021-01-07: 20 mL

## 2021-01-07 MED ORDER — ACETAMINOPHEN 500 MG PO TABS
1000.0000 mg | ORAL_TABLET | ORAL | Status: AC
  Administered 2021-01-07: 1000 mg via ORAL
  Filled 2021-01-07: qty 2

## 2021-01-07 MED ORDER — BUPIVACAINE HCL (PF) 0.5 % IJ SOLN
INTRAMUSCULAR | Status: AC
  Filled 2021-01-07: qty 60

## 2021-01-07 MED ORDER — SUGAMMADEX SODIUM 200 MG/2ML IV SOLN
INTRAVENOUS | Status: DC | PRN
Start: 2021-01-07 — End: 2021-01-07
  Administered 2021-01-07: 140 mg via INTRAVENOUS

## 2021-01-07 MED ORDER — ROCURONIUM BROMIDE 10 MG/ML (PF) SYRINGE
PREFILLED_SYRINGE | INTRAVENOUS | Status: DC | PRN
  Administered 2021-01-07: 60 mg via INTRAVENOUS

## 2021-01-07 MED ORDER — PROPOFOL 10 MG/ML IV BOLUS
INTRAVENOUS | Status: AC
  Filled 2021-01-07: qty 40

## 2021-01-07 MED ORDER — CHLORHEXIDINE GLUCONATE CLOTH 2 % EX PADS: 6.0000 | MEDICATED_PAD | Freq: Once | CUTANEOUS | Status: DC

## 2021-01-07 MED ORDER — PROPOFOL 10 MG/ML IV BOLUS
INTRAVENOUS | Status: DC | PRN
  Administered 2021-01-07: 200 mg via INTRAVENOUS

## 2021-01-07 MED ORDER — ORAL CARE MOUTH RINSE
15.0000 mL | Freq: Once | OROMUCOSAL | Status: AC
  Administered 2021-01-07: 15 mL via OROMUCOSAL

## 2021-01-07 MED ORDER — ROCURONIUM BROMIDE 10 MG/ML (PF) SYRINGE
PREFILLED_SYRINGE | INTRAVENOUS | Status: AC
  Filled 2021-01-07: qty 10

## 2021-01-07 MED ORDER — ONDANSETRON HCL 4 MG/2ML IJ SOLN
INTRAMUSCULAR | Status: AC
  Filled 2021-01-07: qty 2

## 2021-01-07 MED ORDER — EPHEDRINE SULFATE-NACL 50-0.9 MG/10ML-% IV SOSY
PREFILLED_SYRINGE | INTRAVENOUS | Status: DC | PRN
  Administered 2021-01-07 (×2): 5 mg via INTRAVENOUS

## 2021-01-07 MED ORDER — ACETAMINOPHEN 325 MG PO TABS
ORAL_TABLET | ORAL | Status: AC
  Filled 2021-01-07: qty 2

## 2021-01-07 MED ORDER — MIDAZOLAM HCL 2 MG/2ML IJ SOLN
INTRAMUSCULAR | Status: DC | PRN
  Administered 2021-01-07: 2 mg via INTRAVENOUS

## 2021-01-07 MED ORDER — MEPERIDINE HCL 50 MG/ML IJ SOLN: 6.2500 mg | INTRAMUSCULAR | Status: DC | PRN

## 2021-01-07 MED ORDER — MIDAZOLAM HCL 2 MG/2ML IJ SOLN
INTRAMUSCULAR | Status: AC
  Filled 2021-01-07: qty 2

## 2021-01-07 MED ORDER — STERILE WATER FOR IRRIGATION IR SOLN
Status: DC | PRN
  Administered 2021-01-07: 1000 mL

## 2021-01-07 MED ORDER — 0.9 % SODIUM CHLORIDE (POUR BTL) OPTIME
TOPICAL | Status: DC | PRN
  Administered 2021-01-07: 1000 mL

## 2021-01-07 MED ORDER — KETOROLAC TROMETHAMINE 15 MG/ML IJ SOLN: 15.0000 mg | Freq: Once | INTRAMUSCULAR | Status: DC

## 2021-01-07 MED ORDER — CHLORHEXIDINE GLUCONATE 0.12 % MT SOLN: 15.0000 mL | Freq: Once | OROMUCOSAL | Status: AC

## 2021-01-07 MED ORDER — CEFAZOLIN SODIUM-DEXTROSE 2-4 GM/100ML-% IV SOLN
2.0000 g | INTRAVENOUS | Status: AC
  Administered 2021-01-07: 2 g via INTRAVENOUS
  Filled 2021-01-07: qty 100

## 2021-01-07 MED ORDER — FENTANYL CITRATE (PF) 250 MCG/5ML IJ SOLN
INTRAMUSCULAR | Status: DC | PRN
  Administered 2021-01-07: 100 ug via INTRAVENOUS

## 2021-01-07 MED ORDER — ONDANSETRON HCL 4 MG/2ML IJ SOLN
INTRAMUSCULAR | Status: DC | PRN
  Administered 2021-01-07: 4 mg via INTRAVENOUS

## 2021-01-07 MED ORDER — GLYCOPYRROLATE 0.2 MG/ML IJ SOLN
INTRAMUSCULAR | Status: DC | PRN
  Administered 2021-01-07 (×2): .1 mg via INTRAVENOUS

## 2021-01-07 MED ORDER — DEXAMETHASONE SODIUM PHOSPHATE 10 MG/ML IJ SOLN
INTRAMUSCULAR | Status: AC
  Filled 2021-01-07: qty 1

## 2021-01-07 MED ORDER — LACTATED RINGERS IV SOLN: INTRAVENOUS | Status: DC

## 2021-01-07 MED ORDER — FENTANYL CITRATE PF 50 MCG/ML IJ SOSY: 25.0000 ug | PREFILLED_SYRINGE | INTRAMUSCULAR | Status: DC | PRN

## 2021-01-07 MED ORDER — DEXAMETHASONE SODIUM PHOSPHATE 10 MG/ML IJ SOLN
INTRAMUSCULAR | Status: DC | PRN
  Administered 2021-01-07: 8 mg via INTRAVENOUS

## 2021-01-07 SURGICAL SUPPLY — 43 items
ADH SKN CLS APL DERMABOND .7 (GAUZE/BANDAGES/DRESSINGS) ×1
APL PRP STRL LF DISP 70% ISPRP (MISCELLANEOUS) ×1
APPLIER CLIP 5 13 M/L LIGAMAX5 (MISCELLANEOUS)
APR CLP MED LRG 5 ANG JAW (MISCELLANEOUS)
BAG COUNTER SPONGE SURGICOUNT (BAG) IMPLANT
BAG SPEC RTRVL LRG 6X4 10 (ENDOMECHANICALS) ×1
BAG SPNG CNTER NS LX DISP (BAG)
CHLORAPREP W/TINT 26 (MISCELLANEOUS) ×2 IMPLANT
CLIP APPLIE 5 13 M/L LIGAMAX5 (MISCELLANEOUS) IMPLANT
COVER SURGICAL LIGHT HANDLE (MISCELLANEOUS) ×2 IMPLANT
CUTTER FLEX LINEAR 45M (STAPLE) ×2 IMPLANT
DECANTER SPIKE VIAL GLASS SM (MISCELLANEOUS) ×2 IMPLANT
DERMABOND ADVANCED (GAUZE/BANDAGES/DRESSINGS) ×1
DERMABOND ADVANCED .7 DNX12 (GAUZE/BANDAGES/DRESSINGS) ×1 IMPLANT
DRAPE LAPAROSCOPIC ABDOMINAL (DRAPES) ×2 IMPLANT
ELECT COAG MONOPOLAR (ELECTROSURGICAL) ×2
ELECT REM PT RETURN 15FT ADLT (MISCELLANEOUS) ×2 IMPLANT
ELECTRODE COAG MONOPOLAR (ELECTROSURGICAL) ×1 IMPLANT
GLOVE SURG ENC MOIS LTX SZ7.5 (GLOVE) ×2 IMPLANT
GOWN STRL REUS W/TWL XL LVL3 (GOWN DISPOSABLE) ×4 IMPLANT
IRRIG SUCT STRYKERFLOW 2 WTIP (MISCELLANEOUS) ×2
IRRIGATION SUCT STRKRFLW 2 WTP (MISCELLANEOUS) ×1 IMPLANT
IV LACTATED RINGERS 1000ML (IV SOLUTION) ×1 IMPLANT
KIT BASIN OR (CUSTOM PROCEDURE TRAY) ×2 IMPLANT
KIT TURNOVER KIT A (KITS) IMPLANT
NS IRRIG 1000ML POUR BTL (IV SOLUTION) ×1 IMPLANT
PENCIL SMOKE EVACUATOR (MISCELLANEOUS) IMPLANT
POUCH SPECIMEN RETRIEVAL 10MM (ENDOMECHANICALS) ×2 IMPLANT
RELOAD 45 VASCULAR/THIN (ENDOMECHANICALS) IMPLANT
RELOAD STAPLE 45 3.5 BLU ETS (ENDOMECHANICALS) IMPLANT
RELOAD STAPLE TA45 3.5 REG BLU (ENDOMECHANICALS) ×2 IMPLANT
SCISSORS LAP 5X35 DISP (ENDOMECHANICALS) ×1 IMPLANT
SET TUBE SMOKE EVAC HIGH FLOW (TUBING) ×2 IMPLANT
SHEARS HARMONIC ACE PLUS 36CM (ENDOMECHANICALS) ×1 IMPLANT
SLEEVE XCEL OPT CAN 5 100 (ENDOMECHANICALS) ×2 IMPLANT
SUT MNCRL AB 4-0 PS2 18 (SUTURE) ×2 IMPLANT
TOWEL OR 17X26 10 PK STRL BLUE (TOWEL DISPOSABLE) ×2 IMPLANT
TOWEL OR NON WOVEN STRL DISP B (DISPOSABLE) ×2 IMPLANT
TRAY FOLEY MTR SLVR 16FR STAT (SET/KITS/TRAYS/PACK) ×2 IMPLANT
TRAY LAPAROSCOPIC (CUSTOM PROCEDURE TRAY) ×2 IMPLANT
TROCAR BLADELESS OPT 5 100 (ENDOMECHANICALS) ×2 IMPLANT
TROCAR XCEL BLUNT TIP 100MML (ENDOMECHANICALS) ×2 IMPLANT
WATER STERILE IRR 1000ML POUR (IV SOLUTION) ×1 IMPLANT

## 2021-01-07 NOTE — Anesthesia Procedure Notes (Signed)
Procedure Name: Intubation Date/Time: 01/07/2021 9:39 AM Performed by: Eben Burow, CRNA Pre-anesthesia Checklist: Patient identified, Emergency Drugs available, Suction available, Patient being monitored and Timeout performed Patient Re-evaluated:Patient Re-evaluated prior to induction Oxygen Delivery Method: Circle system utilized Preoxygenation: Pre-oxygenation with 100% oxygen Induction Type: IV induction Ventilation: Mask ventilation without difficulty Laryngoscope Size: Mac and 4 Grade View: Grade I Tube type: Oral Tube size: 6.5 mm Number of attempts: 1 Airway Equipment and Method: Stylet Placement Confirmation: ETT inserted through vocal cords under direct vision, positive ETCO2 and breath sounds checked- equal and bilateral Secured at: 21 cm Tube secured with: Tape Dental Injury: Teeth and Oropharynx as per pre-operative assessment  Comments: Pt asked for a smaller ett due to previous sore throat after intubation which lasted 5 days.  Atraumatic DVL x1 with 6.5 ett placed, ulcer on right side of vallecula noted, Dr Jillyn Hidden visualized ulcer and will place picture in chart.

## 2021-01-07 NOTE — Interval H&P Note (Signed)
History and Physical Interval Note: no change in H and P  01/07/2021 7:11 AM  Brindle L Shelbie Proctor  has presented today for surgery, with the diagnosis of APPENDICITIS.  The various methods of treatment have been discussed with the patient and family. After consideration of risks, benefits and other options for treatment, the patient has consented to  Procedure(s): APPENDECTOMY LAPAROSCOPIC (N/A) as a surgical intervention.  The patient's history has been reviewed, patient examined, no change in status, stable for surgery.  I have reviewed the patient's chart and labs.  Questions were answered to the patient's satisfaction.     Coralie Keens

## 2021-01-07 NOTE — Op Note (Signed)
APPENDECTOMY LAPAROSCOPIC  Procedure Note  Pamela Ross 01/07/2021   Pre-op Diagnosis: PREVIOUS PERFORATED APPENDICITIS     Post-op Diagnosis: SAME  Procedure(s): LAPAROSCOPIC APPENDECTOMY  Surgeon(s): Coralie Keens, MD  Anesthesia: General  Staff:  Circulator: Roselind Rily, RN  Estimated Blood Loss: Minimal               Specimens: sent to path  Indications: This is a 65 year old female who presented approximately 6 to 7 weeks ago with perforated appendicitis.  Given the phlegmon but her minimal tenderness, it was managed nonoperatively.  Follow-up CT scan showed significant improvement.  The decision was made to proceed to the operating today for interval appendectomy  Findings: There was dilation of the tip of the appendix and it was fixated to the terminal ileum at this area but there is no further purulence or active inflammation.  Procedure: Patient brought to operating identifies correct patient.  She was placed on the operating table general anesthesia was induced.  Her abdomen was then prepped and draped in usual sterile fashion.  I made a small vertical incision below the umbilicus with a scalpel.  I carried this down to the fascia which was then opened the scalpel.  Hemostat was used passed to the peritoneal cavity under direct vision.  A 0 Vicryl pursestring suture was placed around the fascial opening.  The [facility] port was placed through the opening and insufflation of the abdomen was begun.  I placed a 5 mm trocar in the patient's right upper quadrant and another in the left lower quadrant both under direct vision.  Identified the cecum going in the pelvis and was able to easily elevated identify the base of the appendix.  The appendix then went down into the pelvis and then curled back and was fixated at the tip to the terminal ileum.  The right ovary and fallopian tube appeared normal.  I transected the base of the appendix with the laparoscopic  GIA stapler.  I then took down the mesoappendix with the harmonic scalpel.  The tip of the appendix was dilated and firm from the previous perforation.  Using sharp dissection with the scissors I was able to separate it from the small bowel without entering the small bowel.  I took down the rest of the mesentery with the harmonic scalpel freeing up the appendix totally.  The appendix was then placed in an Endosac and removed the incision at the umbilicus.  I then copiously irrigated the abdomen normal saline.  I reevaluated the small bowel where the appendix had been stuck to it and saw no evidence of bowel injury.  I was able to easily milk small bowel contents through this area and again saw no leak.  At this point hemostasis peer to be achieved.  All ports were removed under direct vision the abdomen was deflated.  The 0 Vicryl the umbilicus was tied in place closing the fascial defect.  All incisions were anesthetized Marcaine and closed with 4-0 Monocryl.  Dermabond was then applied.  The patient tolerated the procedure well.  All the counts were correct at the end of the procedure.  Patient was then extubated in the operating room and taken in stable addition to the recovery room.          Coralie Keens   Date: 01/07/2021  Time: 10:33 AM

## 2021-01-07 NOTE — Discharge Instructions (Signed)
[facility] ______[facility] LAPAROSCOPIC SURGERY: POST OP INSTRUCTIONS Always review your discharge instruction sheet given to you by the facility where your surgery was performed. IF YOU HAVE DISABILITY OR FAMILY LEAVE FORMS, YOU MUST BRING THEM TO THE OFFICE FOR PROCESSING.   DO NOT GIVE THEM TO YOUR DOCTOR.  A prescription for pain medication may be given to you upon discharge.  Take your pain medication as prescribed, if needed.  If narcotic pain medicine is not needed, then you may take acetaminophen (Tylenol) or ibuprofen (Advil) as needed. Take your usually prescribed medications unless otherwise directed. If you need a refill on your pain medication, please contact your pharmacy.  They will contact our office to request authorization. Prescriptions will not be filled after 5pm or on week-ends. You should follow a light diet the first few days after arrival home, such as soup and crackers, etc.  Be sure to include lots of fluids daily. Most patients will experience some swelling and bruising in the area of the incisions.  Ice packs will help.  Swelling and bruising can take several days to resolve.  It is common to experience some constipation if taking pain medication after surgery.  Increasing fluid intake and taking a stool softener (such as Colace) will usually help or prevent this problem from occurring.  A mild laxative (Milk of Magnesia or Miralax) should be taken according to package instructions if there are no bowel movements after 48 hours. Unless discharge instructions indicate otherwise, you may remove your bandages 24-48 hours after surgery, and you may shower at that time.  You may have steri-strips (small skin tapes) in place directly over the incision.  These strips should be left on the skin for 7-10 days.  If your surgeon used skin glue on the incision, you may shower in 24 hours.  The glue will flake off over the next 2-3 weeks.  Any sutures or staples will be  removed at the office during your follow-up visit. ACTIVITIES:  You may resume regular (light) daily activities beginning the next day--such as daily self-care, walking, climbing stairs--gradually increasing activities as tolerated.  You may have sexual intercourse when it is comfortable.  Refrain from any heavy lifting or straining until approved by your doctor. You may drive when you are no longer taking prescription pain medication, you can comfortably wear a seatbelt, and you can safely maneuver your car and apply brakes. RETURN TO WORK:  __________________________________________________________ Dennis Bast should see your doctor in the office for a follow-up appointment approximately 2-3 weeks after your surgery.  Make sure that you call for this appointment within a day or two after you arrive home to insure a convenient appointment time. OTHER INSTRUCTIONS: OK TO SHOWER STARTING TOMORROW STAY ON A LIGHT , LIQUIDY/SOUP DIET TONIGHT ICE PACK, TYLENOL, AND IBUPROFEN ALSO FOR PAIN __________________________________________________________________________________________________________________________ __________________________________________________________________________________________________________________________ WHEN TO CALL YOUR DOCTOR: Fever over 101.0 Inability to urinate Continued bleeding from incision. Increased pain, redness, or drainage from the incision. Increasing abdominal pain  The clinic staff is available to answer your questions during regular business hours.  Please don't hesitate to call and ask to speak to one of the nurses for clinical concerns.  If you have a medical emergency, go to the nearest emergency room or call 911.  A surgeon from [facility] is always on call at the hospital. [address] [place], [place], [place]  [address] ? [address], [place], [place]   [id number] [phone] ? [phone] ? FAX [phone] Web site:  [email or web address]

## 2021-01-07 NOTE — Transfer of Care (Signed)
Immediate Anesthesia Transfer of Care Note  Patient: Pamela Ross  Procedure(s) Performed: APPENDECTOMY LAPAROSCOPIC (Abdomen)  Patient Location: PACU  Anesthesia Type:General  Level of Consciousness: awake, alert , oriented and patient cooperative  Airway & Oxygen Therapy: Patient Spontanous Breathing and Patient connected to face mask oxygen  Post-op Assessment: Report given to RN, Post -op Vital signs reviewed and stable and Patient moving all extremities  Post vital signs: Reviewed and stable  Last Vitals:  Vitals Value Taken Time  BP 137/61 01/07/21 1045  Temp    Pulse 61 01/07/21 1047  Resp 18 01/07/21 1047  SpO2 100 % 01/07/21 1047  Vitals shown include unvalidated device data.  Last Pain:  Vitals:   01/07/21 0705  TempSrc: Oral         Complications: No notable events documented.

## 2021-01-07 NOTE — Anesthesia Postprocedure Evaluation (Signed)
Anesthesia Post Note  Patient: Pamela Ross  Procedure(s) Performed: APPENDECTOMY LAPAROSCOPIC (Abdomen)     Patient location during evaluation: PACU Anesthesia Type: General Level of consciousness: awake and sedated Pain management: pain level controlled Vital Signs Assessment: post-procedure vital signs reviewed and stable Respiratory status: spontaneous breathing Cardiovascular status: stable Postop Assessment: no apparent nausea or vomiting Anesthetic complications: no   No notable events documented.  Last Vitals:  Vitals:   01/07/21 1100 01/07/21 1115  BP: 118/63 128/71  Pulse: (!) 57 (!) 52  Resp: 15 15  Temp:    SpO2: 98% 94%    Last Pain:  Vitals:   01/07/21 1100  TempSrc:   PainSc: [place]

## 2021-01-07 NOTE — Anesthesia Preprocedure Evaluation (Signed)
Anesthesia Evaluation  Patient identified by MRN, date of birth, ID band Patient awake    Reviewed: Allergy & Precautions, NPO status , Patient's Chart, lab work & pertinent test results, reviewed documented beta blocker date and time   Airway Mallampati: I       Dental no notable dental hx.    Pulmonary former smoker,    Pulmonary exam normal        Cardiovascular hypertension, Pt. on home beta blockers Normal cardiovascular exam     Neuro/Psych PSYCHIATRIC DISORDERS Anxiety    GI/Hepatic   Endo/Other    Renal/GU      Musculoskeletal   Abdominal Normal abdominal exam  (+)   Peds  Hematology   Anesthesia Other Findings   Reproductive/Obstetrics                             Anesthesia Physical Anesthesia Plan  ASA: 2  Anesthesia Plan: General   Post-op Pain Management:    Induction: Intravenous  PONV Risk Score and Plan: 4 or greater and Ondansetron, Dexamethasone and Midazolam  Airway Management Planned: Oral ETT  Additional Equipment: None  Intra-op Plan:   Post-operative Plan: Extubation in OR  Informed Consent: I have reviewed the patients History and Physical, chart, labs and discussed the procedure including the risks, benefits and alternatives for the proposed anesthesia with the patient or authorized representative who has indicated his/her understanding and acceptance.     Dental advisory given  Plan Discussed with: CRNA  Anesthesia Plan Comments:         Anesthesia Quick Evaluation

## 2021-01-08 ENCOUNTER — Encounter (HOSPITAL_COMMUNITY): Payer: Self-pay | Admitting: Surgery

## 2021-01-10 LAB — SURGICAL PATHOLOGY

## 2021-02-09 DIAGNOSIS — I10 Essential (primary) hypertension: Secondary | ICD-10-CM | POA: Diagnosis not present

## 2021-02-09 DIAGNOSIS — E559 Vitamin D deficiency, unspecified: Secondary | ICD-10-CM | POA: Diagnosis not present

## 2021-02-09 DIAGNOSIS — E7800 Pure hypercholesterolemia, unspecified: Secondary | ICD-10-CM | POA: Diagnosis not present

## 2021-02-15 DIAGNOSIS — F411 Generalized anxiety disorder: Secondary | ICD-10-CM | POA: Diagnosis not present

## 2021-02-15 DIAGNOSIS — Z0000 Encounter for general adult medical examination without abnormal findings: Secondary | ICD-10-CM | POA: Diagnosis not present

## 2021-02-15 DIAGNOSIS — I10 Essential (primary) hypertension: Secondary | ICD-10-CM | POA: Diagnosis not present

## 2021-02-15 DIAGNOSIS — E559 Vitamin D deficiency, unspecified: Secondary | ICD-10-CM | POA: Diagnosis not present

## 2021-02-15 DIAGNOSIS — E7800 Pure hypercholesterolemia, unspecified: Secondary | ICD-10-CM | POA: Diagnosis not present

## 2021-03-30 ENCOUNTER — Encounter: Payer: Self-pay | Admitting: Gastroenterology

## 2021-05-24 DIAGNOSIS — H35371 Puckering of macula, right eye: Secondary | ICD-10-CM | POA: Diagnosis not present

## 2021-05-24 DIAGNOSIS — H524 Presbyopia: Secondary | ICD-10-CM | POA: Diagnosis not present

## 2021-08-29 DIAGNOSIS — F411 Generalized anxiety disorder: Secondary | ICD-10-CM | POA: Diagnosis not present

## 2021-08-29 DIAGNOSIS — I10 Essential (primary) hypertension: Secondary | ICD-10-CM | POA: Diagnosis not present

## 2021-12-13 DIAGNOSIS — Z23 Encounter for immunization: Secondary | ICD-10-CM | POA: Diagnosis not present

## 2022-02-15 DIAGNOSIS — E7800 Pure hypercholesterolemia, unspecified: Secondary | ICD-10-CM | POA: Diagnosis not present

## 2022-02-15 DIAGNOSIS — E559 Vitamin D deficiency, unspecified: Secondary | ICD-10-CM | POA: Diagnosis not present

## 2022-02-15 DIAGNOSIS — I10 Essential (primary) hypertension: Secondary | ICD-10-CM | POA: Diagnosis not present

## 2022-02-17 DIAGNOSIS — I10 Essential (primary) hypertension: Secondary | ICD-10-CM | POA: Diagnosis not present

## 2022-02-17 DIAGNOSIS — F411 Generalized anxiety disorder: Secondary | ICD-10-CM | POA: Diagnosis not present

## 2022-02-17 DIAGNOSIS — Z6827 Body mass index (BMI) 27.0-27.9, adult: Secondary | ICD-10-CM | POA: Diagnosis not present

## 2022-02-17 DIAGNOSIS — M19041 Primary osteoarthritis, right hand: Secondary | ICD-10-CM | POA: Diagnosis not present

## 2022-02-17 DIAGNOSIS — M8588 Other specified disorders of bone density and structure, other site: Secondary | ICD-10-CM | POA: Diagnosis not present

## 2022-02-17 DIAGNOSIS — E559 Vitamin D deficiency, unspecified: Secondary | ICD-10-CM | POA: Diagnosis not present

## 2022-02-17 DIAGNOSIS — E7800 Pure hypercholesterolemia, unspecified: Secondary | ICD-10-CM | POA: Diagnosis not present

## 2022-02-17 DIAGNOSIS — Z0000 Encounter for general adult medical examination without abnormal findings: Secondary | ICD-10-CM | POA: Diagnosis not present

## 2022-05-23 DIAGNOSIS — Z23 Encounter for immunization: Secondary | ICD-10-CM | POA: Diagnosis not present

## 2022-06-02 DIAGNOSIS — J029 Acute pharyngitis, unspecified: Secondary | ICD-10-CM | POA: Diagnosis not present

## 2022-06-02 DIAGNOSIS — R051 Acute cough: Secondary | ICD-10-CM | POA: Diagnosis not present

## 2022-06-02 DIAGNOSIS — Z6827 Body mass index (BMI) 27.0-27.9, adult: Secondary | ICD-10-CM | POA: Diagnosis not present

## 2022-06-02 DIAGNOSIS — J069 Acute upper respiratory infection, unspecified: Secondary | ICD-10-CM | POA: Diagnosis not present

## 2022-06-05 DIAGNOSIS — Z6828 Body mass index (BMI) 28.0-28.9, adult: Secondary | ICD-10-CM | POA: Diagnosis not present

## 2022-06-05 DIAGNOSIS — J069 Acute upper respiratory infection, unspecified: Secondary | ICD-10-CM | POA: Diagnosis not present

## 2022-07-20 DIAGNOSIS — Z6827 Body mass index (BMI) 27.0-27.9, adult: Secondary | ICD-10-CM | POA: Diagnosis not present

## 2022-07-20 DIAGNOSIS — J208 Acute bronchitis due to other specified organisms: Secondary | ICD-10-CM | POA: Diagnosis not present

## 2022-11-09 DIAGNOSIS — E7800 Pure hypercholesterolemia, unspecified: Secondary | ICD-10-CM | POA: Diagnosis not present

## 2022-11-09 DIAGNOSIS — Z6827 Body mass index (BMI) 27.0-27.9, adult: Secondary | ICD-10-CM | POA: Diagnosis not present

## 2022-11-09 DIAGNOSIS — Z86010 Personal history of colonic polyps: Secondary | ICD-10-CM | POA: Diagnosis not present

## 2022-11-09 DIAGNOSIS — F411 Generalized anxiety disorder: Secondary | ICD-10-CM | POA: Diagnosis not present

## 2022-11-09 DIAGNOSIS — E559 Vitamin D deficiency, unspecified: Secondary | ICD-10-CM | POA: Diagnosis not present

## 2022-11-09 DIAGNOSIS — M8588 Other specified disorders of bone density and structure, other site: Secondary | ICD-10-CM | POA: Diagnosis not present

## 2022-11-09 DIAGNOSIS — I10 Essential (primary) hypertension: Secondary | ICD-10-CM | POA: Diagnosis not present

## 2022-11-11 ENCOUNTER — Other Ambulatory Visit (HOSPITAL_BASED_OUTPATIENT_CLINIC_OR_DEPARTMENT_OTHER): Payer: Self-pay | Admitting: Family Medicine

## 2022-11-11 DIAGNOSIS — M8580 Other specified disorders of bone density and structure, unspecified site: Secondary | ICD-10-CM

## 2022-11-16 DIAGNOSIS — Z23 Encounter for immunization: Secondary | ICD-10-CM | POA: Diagnosis not present

## 2022-12-06 DIAGNOSIS — Z23 Encounter for immunization: Secondary | ICD-10-CM | POA: Diagnosis not present

## 2023-04-16 IMAGING — CT CT ABD-PELV W/ CM
2 of 5 series · 16 of 46 positions shown, 18 images · IV contrast (omnipaque)
Comparison: 11/30/2020

CLINICAL DATA: Contained perforated appendicitis. Monitoring
appendicitis.

EXAM:
CT ABDOMEN AND PELVIS WITH CONTRAST
TECHNIQUE: Multidetector CT imaging of the abdomen and pelvis was performed
using the standard protocol following bolus administration of
intravenous contrast.
CONTRAST:  100mL OMNIPAQUE IOHEXOL 300 MG/ML  SOLN

[Series 2: abd pel w · axial · 0.71mm/px · z∈[-421,-41]mm · 13 of 86 slices shown, 15 images]
[im 5/86  soft-tissue]
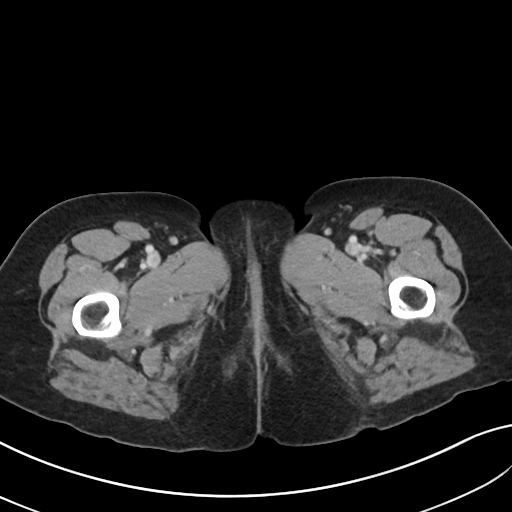
[im 5/86  bone]
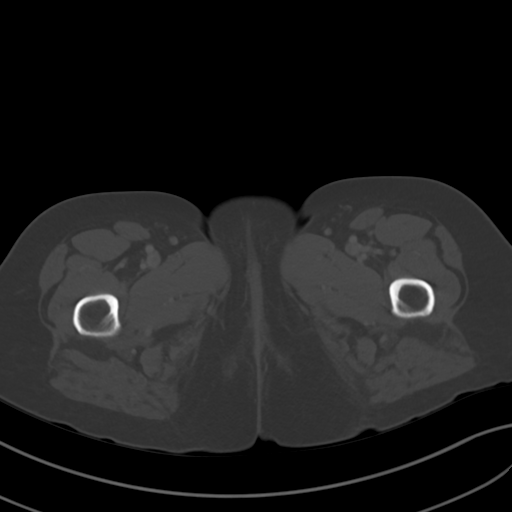
[im 10/86  soft-tissue]
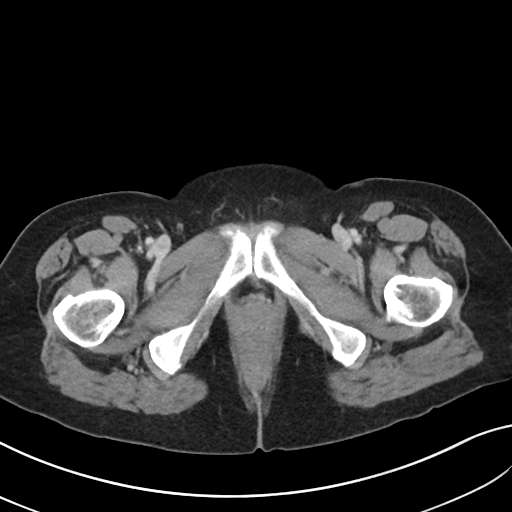
[im 19/86  soft-tissue]
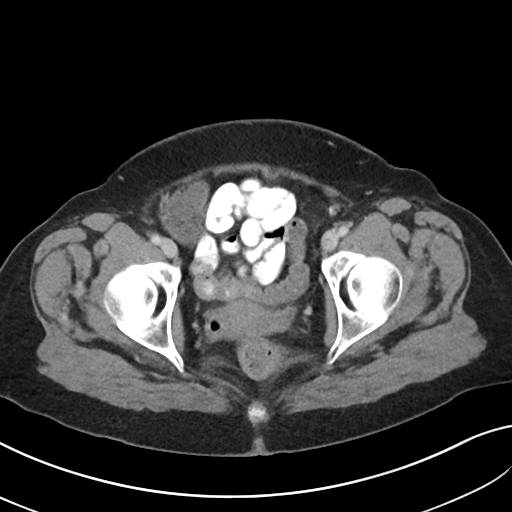
[im 24/86  soft-tissue]
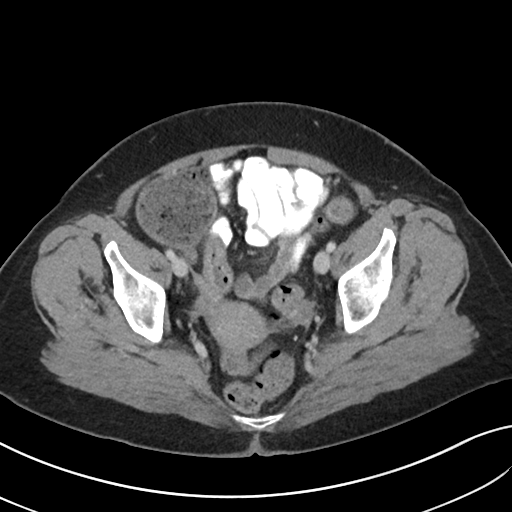
[im 29/86  soft-tissue]
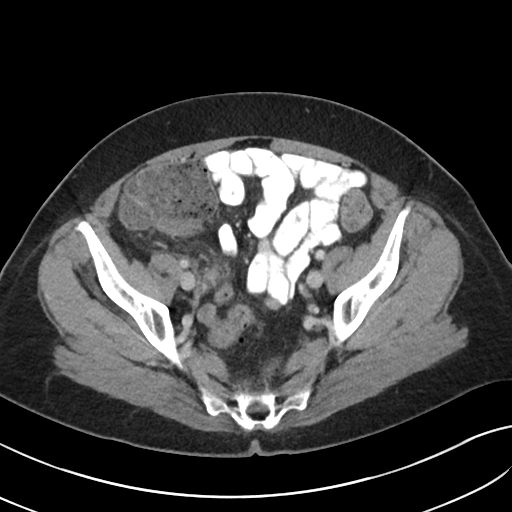
[im 38/86  soft-tissue]
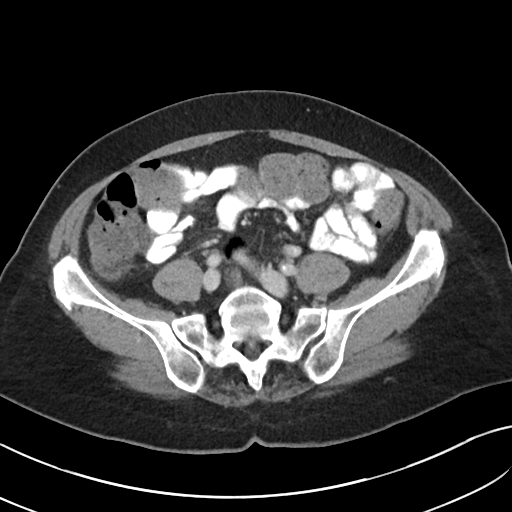
[im 43/86  soft-tissue]
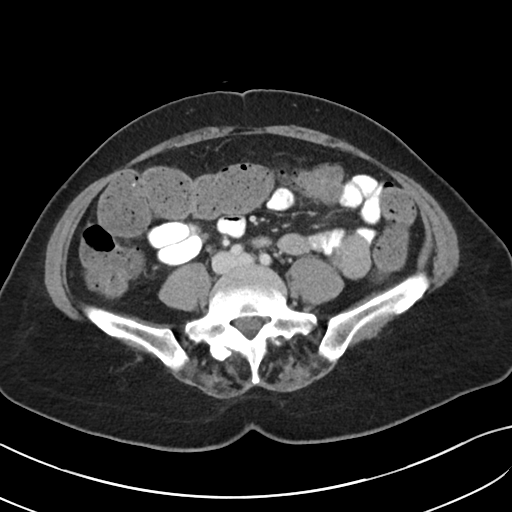
[im 48/86  soft-tissue]
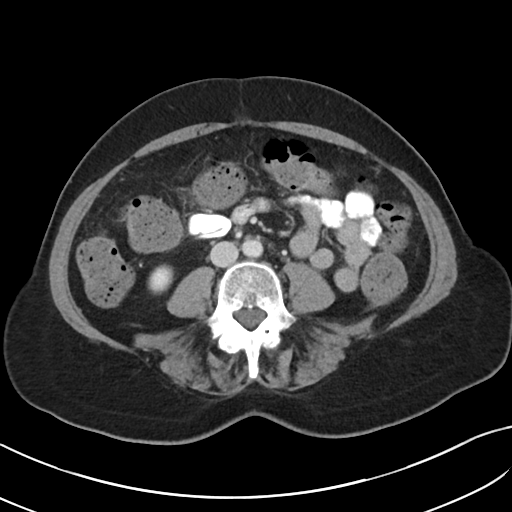
[im 57/86  soft-tissue]
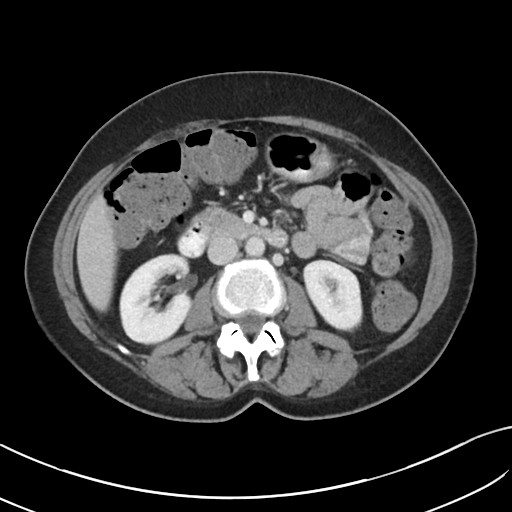
[im 57/86  bone]
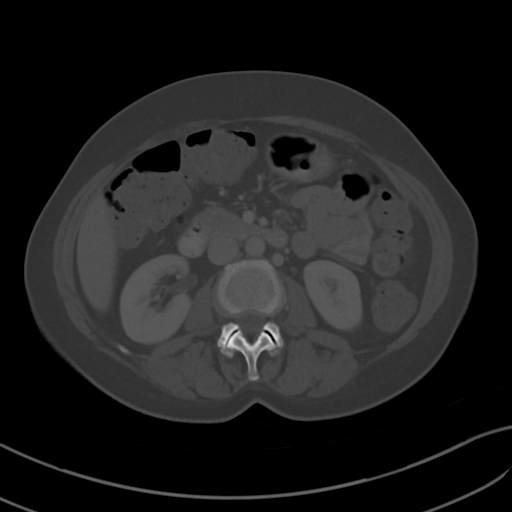
[im 62/86  soft-tissue]
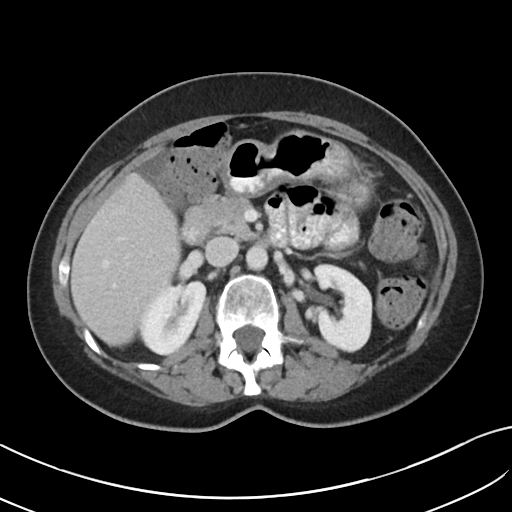
[im 67/86  soft-tissue]
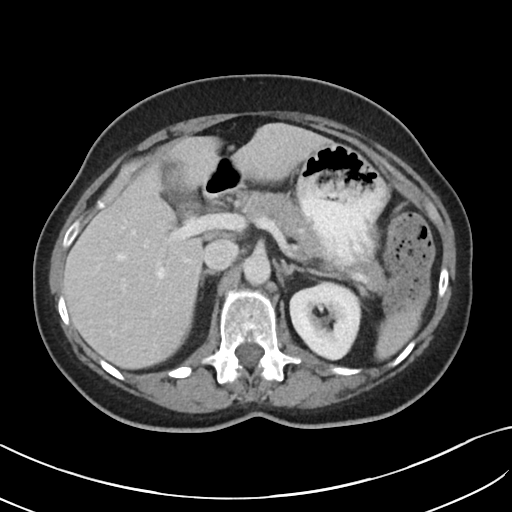
[im 76/86  soft-tissue]
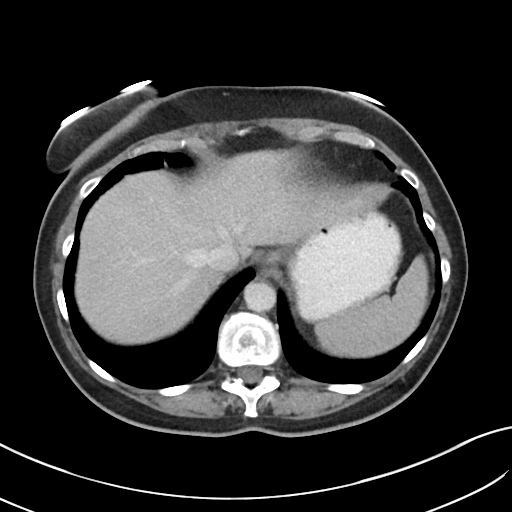
[im 81/86  soft-tissue]
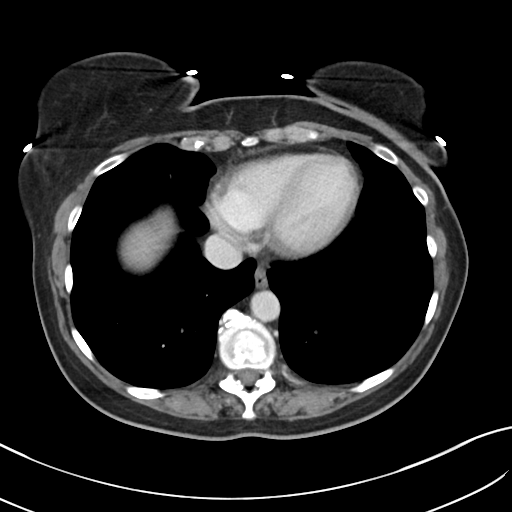

[Series 5: coronal · coronal · 0.83mm/px · 3 of 91 slices shown]
[im 31/91  soft-tissue]
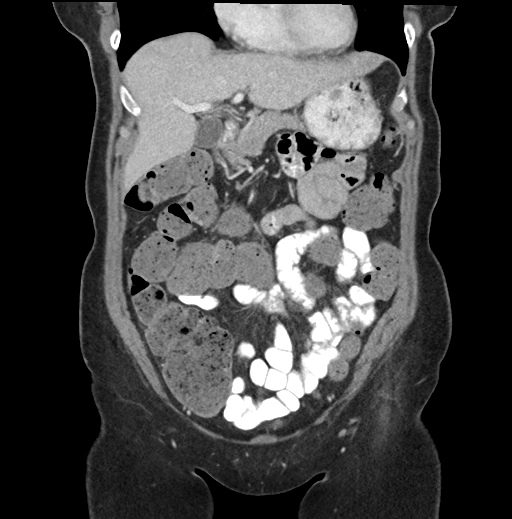
[im 41/91  soft-tissue]
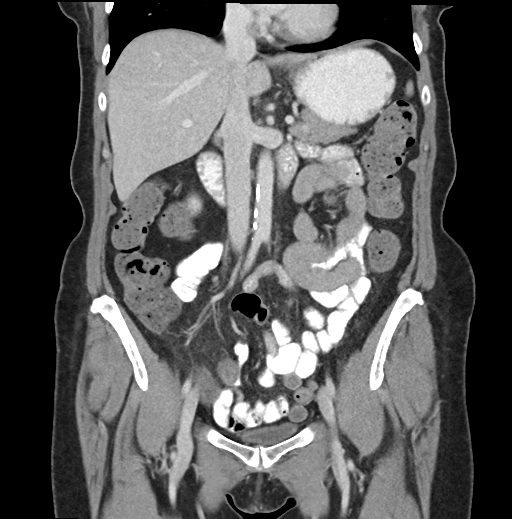
[im 51/91  soft-tissue]
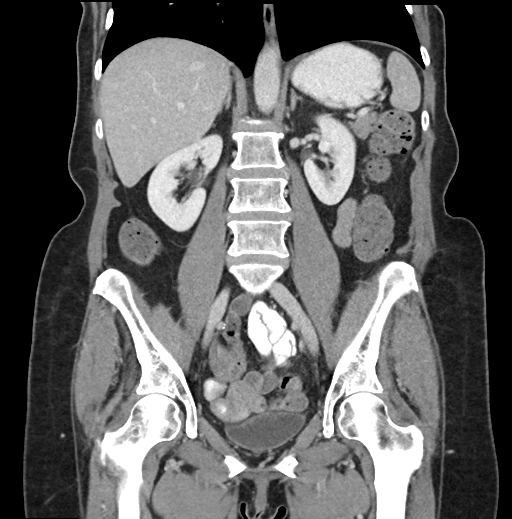

[16 of 46 positions shown; findings below may reference images not displayed]

FINDINGS: Lower chest: Lung bases are clear.  No pleural effusions.

Hepatobiliary: Normal appearance of the liver and gallbladder. No
discrete liver lesion. Portal venous system is patent. No biliary
dilatation.

Pancreas: Unremarkable. No pancreatic ductal dilatation or
surrounding inflammatory changes.

Spleen: Normal in size without focal abnormality.

Adrenals/Urinary Tract: Normal adrenal glands. Normal urinary
bladder. Normal appearance of both kidneys without hydronephrosis.

Stomach/Bowel: Normal appearance of the stomach. Oral contrast in
small bowel without small bowel dilatation. Large amount of stool
throughout the colon. Inflammatory changes around the distal ileum
and appendix in the right lower quadrant have markedly decreased.
Mild inflammatory changes around the appendix. There is a small gas
collection near the expected region of the appendix tip on sequence
2 image 60. This area measures up to 1.5 cm.

Vascular/Lymphatic: Atherosclerotic calcifications involving the
abdominal aorta without aneurysm. No lymph node enlargement in the
abdomen or pelvis.

Reproductive: Normal appearance of the uterus and left adnexa. Right
adnexa is near the appendix but no gross abnormality in this area.
Small amount of gas along the right side of the vagina.

Other: Negative for ascites.

Musculoskeletal: Degenerative facet disease in lumbar spine. No
acute bone abnormality.
IMPRESSION: 1. Inflammatory process in the right lower quadrant centered around
the appendix has markedly decreased. There is a small residual
gas-filled collection near the tip of the appendix measuring roughly
1.5 cm and compatible with a small periappendiceal abscess.
Inflammation involving the distal ileum has resolved.
2. Large amount of stool in the colon.

## 2023-04-16 IMAGING — MG MM DIGITAL SCREENING BILAT W/ TOMO AND CAD
8 series · 8 of 24 positions shown · non-contrast
Comparison: Previous exam(s).

CLINICAL DATA: Screening.

EXAM:
DIGITAL SCREENING BILATERAL MAMMOGRAM WITH TOMOSYNTHESIS AND CAD
TECHNIQUE: Bilateral screening digital craniocaudal and mediolateral oblique
mammograms were obtained. Bilateral screening digital breast
tomosynthesis was performed. The images were evaluated with
computer-aided detection.

[L MLO synth-2D]
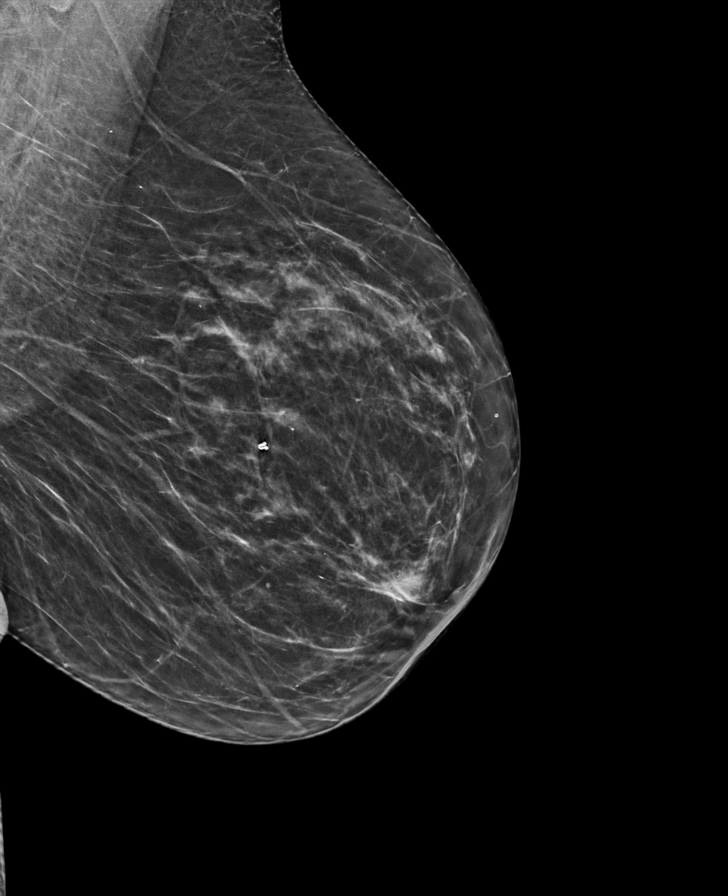

[R MLO synth-2D]
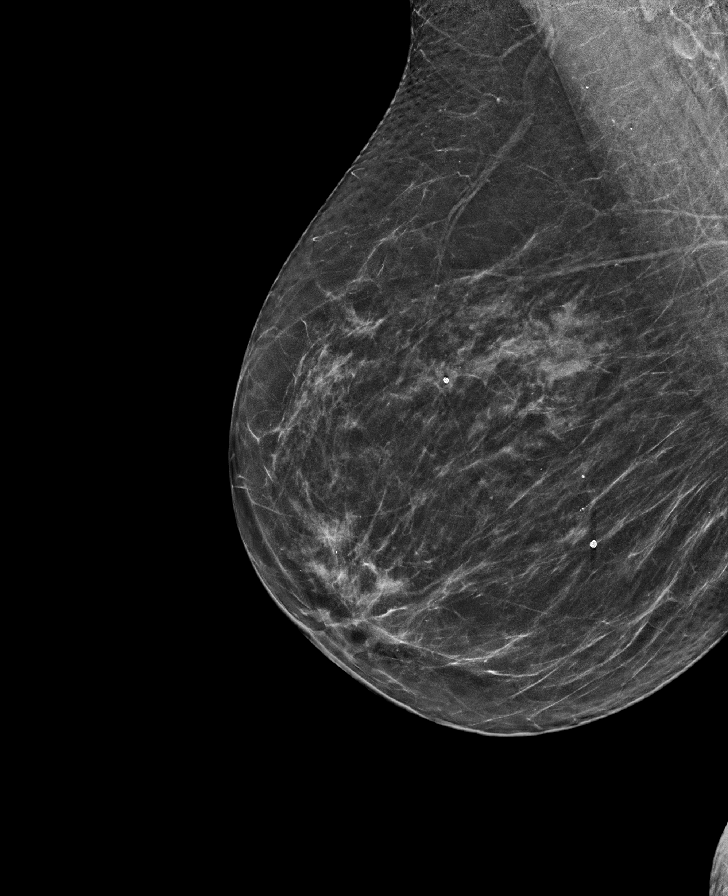

[R CC synth-2D]
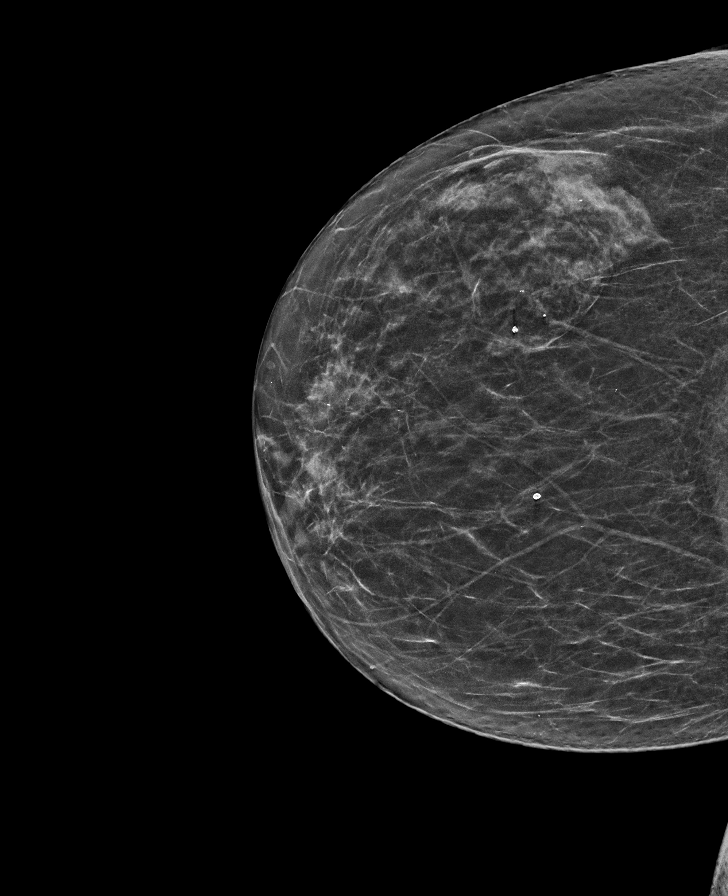

[L CC synth-2D]
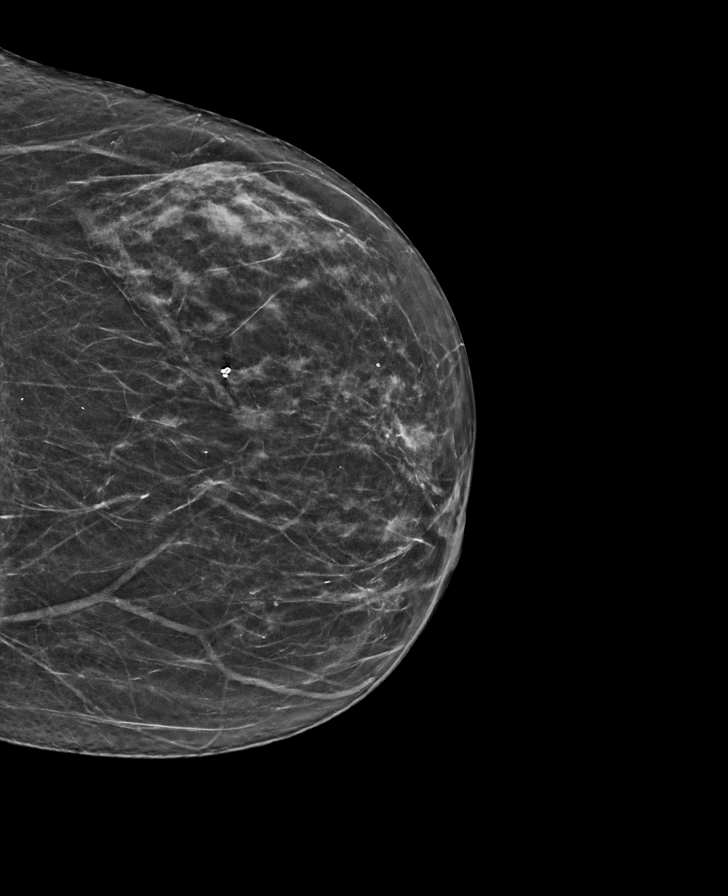

[L MLO tomo · tomo slice 33/64.0]
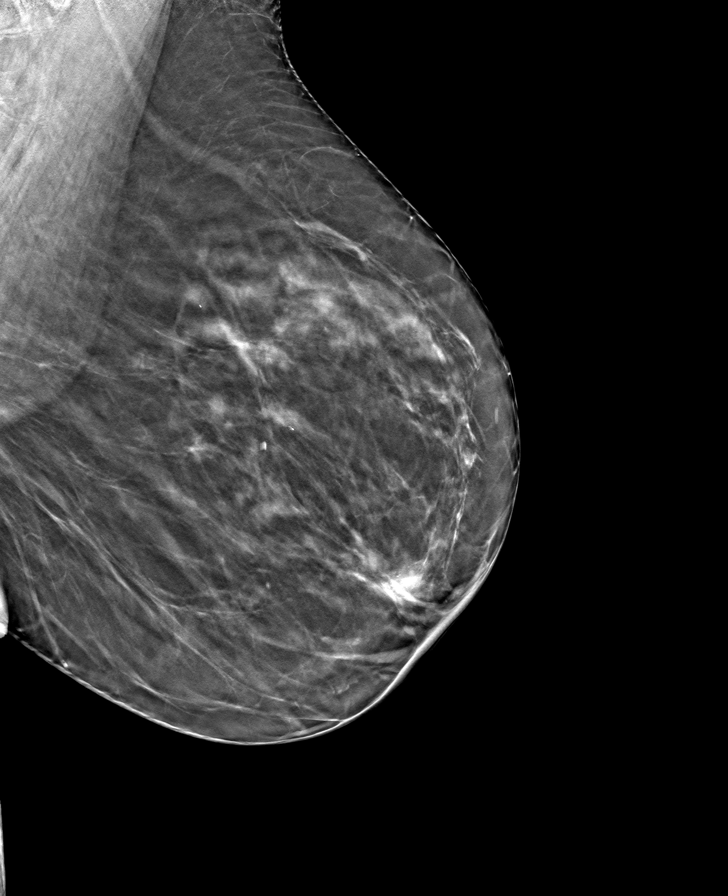

[R MLO tomo · tomo slice 33/65.0]
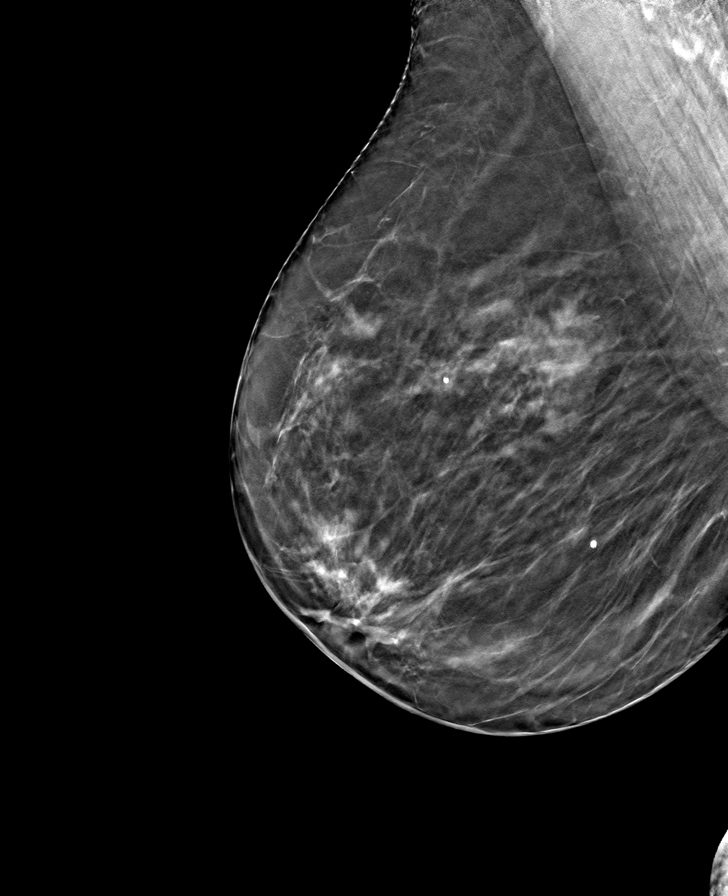

[L CC tomo · tomo slice 31/62.0]
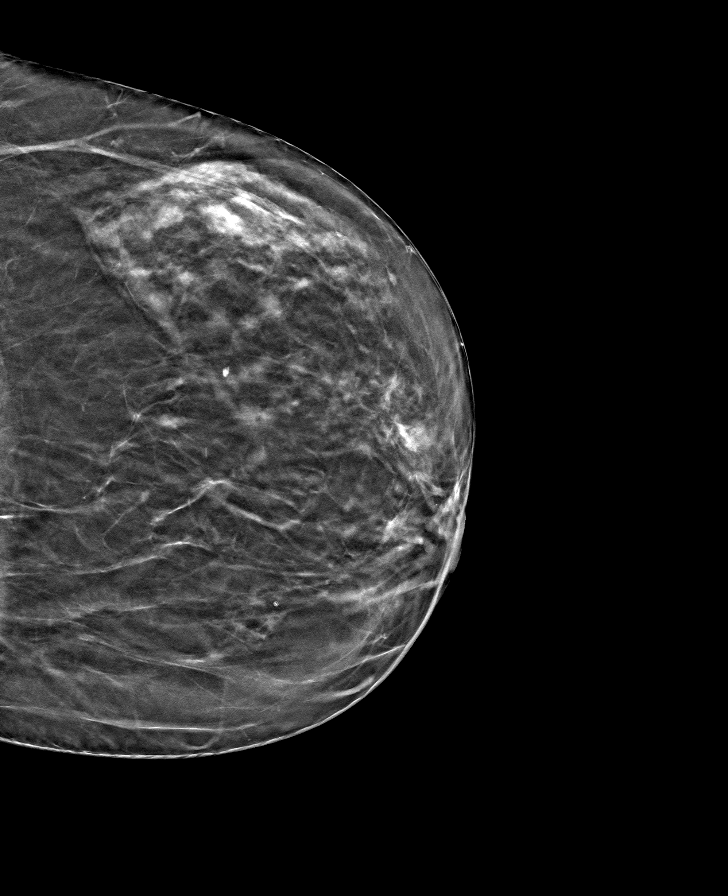

[R CC tomo · tomo slice 32/63.0]
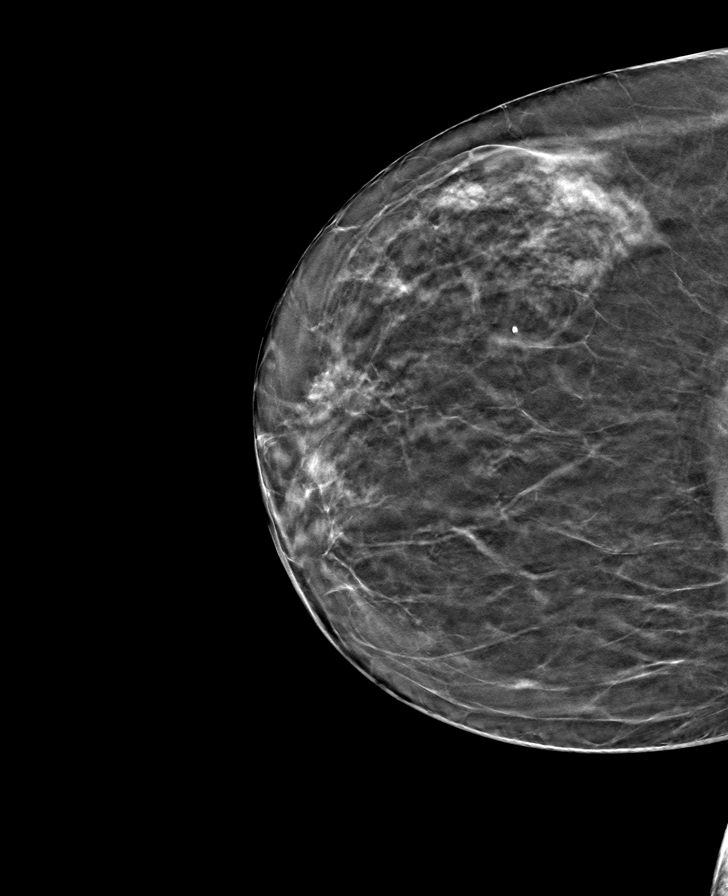

[8 of 24 positions shown; findings below may reference images not displayed]

ACR Breast Density Category b: There are scattered areas of
fibroglandular density.
FINDINGS: There are no findings suspicious for malignancy.
IMPRESSION: No mammographic evidence of malignancy. A result letter of this
screening mammogram will be mailed directly to the patient.

RECOMMENDATION:
Screening mammogram in one year. (Code:51-O-LD2)

BI-RADS CATEGORY  1: Negative.

## 2023-05-09 DIAGNOSIS — I10 Essential (primary) hypertension: Secondary | ICD-10-CM | POA: Diagnosis not present

## 2023-05-09 DIAGNOSIS — E559 Vitamin D deficiency, unspecified: Secondary | ICD-10-CM | POA: Diagnosis not present

## 2023-05-09 DIAGNOSIS — E7800 Pure hypercholesterolemia, unspecified: Secondary | ICD-10-CM | POA: Diagnosis not present

## 2023-05-16 DIAGNOSIS — E7800 Pure hypercholesterolemia, unspecified: Secondary | ICD-10-CM | POA: Diagnosis not present

## 2023-05-16 DIAGNOSIS — I10 Essential (primary) hypertension: Secondary | ICD-10-CM | POA: Diagnosis not present

## 2023-05-16 DIAGNOSIS — M8580 Other specified disorders of bone density and structure, unspecified site: Secondary | ICD-10-CM | POA: Diagnosis not present

## 2023-05-16 DIAGNOSIS — F411 Generalized anxiety disorder: Secondary | ICD-10-CM | POA: Diagnosis not present

## 2023-05-16 DIAGNOSIS — Z1331 Encounter for screening for depression: Secondary | ICD-10-CM | POA: Diagnosis not present

## 2023-05-16 DIAGNOSIS — Z1159 Encounter for screening for other viral diseases: Secondary | ICD-10-CM | POA: Diagnosis not present

## 2023-05-16 DIAGNOSIS — E871 Hypo-osmolality and hyponatremia: Secondary | ICD-10-CM | POA: Diagnosis not present

## 2023-05-16 DIAGNOSIS — Z6828 Body mass index (BMI) 28.0-28.9, adult: Secondary | ICD-10-CM | POA: Diagnosis not present

## 2023-05-16 DIAGNOSIS — E559 Vitamin D deficiency, unspecified: Secondary | ICD-10-CM | POA: Diagnosis not present

## 2023-05-16 DIAGNOSIS — Z0000 Encounter for general adult medical examination without abnormal findings: Secondary | ICD-10-CM | POA: Diagnosis not present

## 2023-06-05 ENCOUNTER — Ambulatory Visit (HOSPITAL_BASED_OUTPATIENT_CLINIC_OR_DEPARTMENT_OTHER)
Admission: RE | Admit: 2023-06-05 | Discharge: 2023-06-05 | Disposition: A | Discharge status: Home / Self Care | Source: Ambulatory Visit | Attending: Family Medicine | Admitting: Family Medicine

## 2023-06-05 ENCOUNTER — Encounter (HOSPITAL_BASED_OUTPATIENT_CLINIC_OR_DEPARTMENT_OTHER): Payer: Self-pay | Admitting: Radiology

## 2023-06-05 DIAGNOSIS — M8589 Other specified disorders of bone density and structure, multiple sites: Secondary | ICD-10-CM | POA: Insufficient documentation

## 2023-06-05 DIAGNOSIS — Z780 Asymptomatic menopausal state: Secondary | ICD-10-CM | POA: Insufficient documentation

## 2023-06-05 DIAGNOSIS — Z1231 Encounter for screening mammogram for malignant neoplasm of breast: Secondary | ICD-10-CM | POA: Diagnosis not present

## 2023-06-05 DIAGNOSIS — Z13820 Encounter for screening for osteoporosis: Secondary | ICD-10-CM | POA: Insufficient documentation

## 2023-06-05 DIAGNOSIS — M8580 Other specified disorders of bone density and structure, unspecified site: Secondary | ICD-10-CM | POA: Insufficient documentation

## 2023-11-14 DIAGNOSIS — F411 Generalized anxiety disorder: Secondary | ICD-10-CM | POA: Diagnosis not present

## 2023-11-14 DIAGNOSIS — Z6828 Body mass index (BMI) 28.0-28.9, adult: Secondary | ICD-10-CM | POA: Diagnosis not present

## 2023-11-19 DIAGNOSIS — Z23 Encounter for immunization: Secondary | ICD-10-CM | POA: Diagnosis not present

## 2023-12-12 DIAGNOSIS — H43812 Vitreous degeneration, left eye: Secondary | ICD-10-CM | POA: Diagnosis not present

## 2023-12-12 DIAGNOSIS — H52203 Unspecified astigmatism, bilateral: Secondary | ICD-10-CM | POA: Diagnosis not present

## 2023-12-12 DIAGNOSIS — Z961 Presence of intraocular lens: Secondary | ICD-10-CM | POA: Diagnosis not present

## 2023-12-12 DIAGNOSIS — H40013 Open angle with borderline findings, low risk, bilateral: Secondary | ICD-10-CM | POA: Diagnosis not present

## 2023-12-12 DIAGNOSIS — Z23 Encounter for immunization: Secondary | ICD-10-CM | POA: Diagnosis not present
